# Patient Record
Sex: Male | Born: 1950 | Race: White | Hispanic: No | Marital: Married | Smoking: Current every day smoker
Health system: Southern US, Community
[De-identification: ages and names within clinical notes are randomized; demographics above are authoritative.]

## PROBLEM LIST (undated history)

## (undated) ENCOUNTER — Emergency Department (HOSPITAL_COMMUNITY): Admission: EM

## (undated) DIAGNOSIS — C3490 Malignant neoplasm of unspecified part of unspecified bronchus or lung: Secondary | ICD-10-CM

## (undated) DIAGNOSIS — M1990 Unspecified osteoarthritis, unspecified site: Secondary | ICD-10-CM

## (undated) DIAGNOSIS — J449 Chronic obstructive pulmonary disease, unspecified: Secondary | ICD-10-CM

## (undated) DIAGNOSIS — K5010 Crohn's disease of large intestine without complications: Secondary | ICD-10-CM

## (undated) DIAGNOSIS — R0602 Shortness of breath: Secondary | ICD-10-CM

## (undated) DIAGNOSIS — C801 Malignant (primary) neoplasm, unspecified: Secondary | ICD-10-CM

## (undated) DIAGNOSIS — Z923 Personal history of irradiation: Secondary | ICD-10-CM

## (undated) DIAGNOSIS — R531 Weakness: Secondary | ICD-10-CM

## (undated) DIAGNOSIS — R079 Chest pain, unspecified: Secondary | ICD-10-CM

## (undated) DIAGNOSIS — N2889 Other specified disorders of kidney and ureter: Secondary | ICD-10-CM

## (undated) DIAGNOSIS — J189 Pneumonia, unspecified organism: Secondary | ICD-10-CM

## (undated) DIAGNOSIS — R569 Unspecified convulsions: Secondary | ICD-10-CM

## (undated) DIAGNOSIS — J439 Emphysema, unspecified: Secondary | ICD-10-CM

## (undated) DIAGNOSIS — R059 Cough, unspecified: Secondary | ICD-10-CM

## (undated) DIAGNOSIS — I2699 Other pulmonary embolism without acute cor pulmonale: Secondary | ICD-10-CM

## (undated) DIAGNOSIS — R634 Abnormal weight loss: Secondary | ICD-10-CM

## (undated) DIAGNOSIS — G40909 Epilepsy, unspecified, not intractable, without status epilepticus: Secondary | ICD-10-CM

## (undated) DIAGNOSIS — R05 Cough: Secondary | ICD-10-CM

## (undated) HISTORY — PX: ABDOMINAL SURGERY: SHX537

## (undated) HISTORY — PX: RECTAL SURGERY: SHX760

## (undated) HISTORY — DX: Other specified disorders of kidney and ureter: N28.89

## (undated) HISTORY — DX: Cough, unspecified: R05.9

## (undated) HISTORY — DX: Abnormal weight loss: R63.4

## (undated) HISTORY — DX: Chronic obstructive pulmonary disease, unspecified: J44.9

## (undated) HISTORY — DX: Chest pain, unspecified: R07.9

## (undated) HISTORY — DX: Cough: R05

## (undated) HISTORY — DX: Other pulmonary embolism without acute cor pulmonale: I26.99

## (undated) HISTORY — DX: Unspecified osteoarthritis, unspecified site: M19.90

## (undated) HISTORY — DX: Epilepsy, unspecified, not intractable, without status epilepticus: G40.909

## (undated) HISTORY — DX: Crohn's disease of large intestine without complications: K50.10

## (undated) HISTORY — DX: Personal history of irradiation: Z92.3

## (undated) HISTORY — DX: Emphysema, unspecified: J43.9

## (undated) HISTORY — DX: Weakness: R53.1

## (undated) HISTORY — PX: OTHER SURGICAL HISTORY: SHX169

## (undated) HISTORY — DX: Malignant (primary) neoplasm, unspecified: C80.1

---

## 2002-04-21 ENCOUNTER — Emergency Department (HOSPITAL_COMMUNITY): Admission: EM | Admit: 2002-04-21 | Discharge: 2002-04-22 | Payer: Self-pay | Admitting: Emergency Medicine

## 2003-01-17 ENCOUNTER — Ambulatory Visit (HOSPITAL_COMMUNITY): Admission: RE | Admit: 2003-01-17 | Discharge: 2003-01-17 | Payer: Self-pay | Admitting: Internal Medicine

## 2007-07-08 ENCOUNTER — Ambulatory Visit: Payer: Self-pay | Admitting: Pulmonary Disease

## 2007-07-13 ENCOUNTER — Ambulatory Visit: Payer: Self-pay | Admitting: Pulmonary Disease

## 2007-09-09 DIAGNOSIS — Z86718 Personal history of other venous thrombosis and embolism: Secondary | ICD-10-CM

## 2007-09-09 DIAGNOSIS — R51 Headache: Secondary | ICD-10-CM

## 2007-09-09 DIAGNOSIS — R519 Headache, unspecified: Secondary | ICD-10-CM | POA: Insufficient documentation

## 2007-09-09 DIAGNOSIS — J438 Other emphysema: Secondary | ICD-10-CM

## 2007-09-12 ENCOUNTER — Ambulatory Visit: Payer: Self-pay | Admitting: Pulmonary Disease

## 2007-11-09 ENCOUNTER — Ambulatory Visit: Admission: RE | Admit: 2007-11-09 | Discharge: 2007-11-09 | Payer: Self-pay | Admitting: Orthopedic Surgery

## 2007-12-27 ENCOUNTER — Ambulatory Visit: Payer: Self-pay | Admitting: Pulmonary Disease

## 2007-12-27 DIAGNOSIS — J209 Acute bronchitis, unspecified: Secondary | ICD-10-CM

## 2008-07-02 ENCOUNTER — Ambulatory Visit: Payer: Self-pay | Admitting: Pulmonary Disease

## 2011-04-28 NOTE — Assessment & Plan Note (Signed)
[facility]                             PULMONARY OFFICE NOTE   Anthony [name], Anthony [name]                        MRN:          [id number]  DATE:07/08/2007                            DOB:          Aug 10, 1951    HISTORY OF PRESENT ILLNESS:  The patient is a 60 year old gentleman who  comes in today for evaluation of dyspnea.  Patient states that he has  had shortness of breath for the last 4-5 years but feels that it is  getting progressively worse.  Currently he has gotten to the point where  he has a 1 block dyspnea on exertion and will also get very winded even  bringing one bag of groceries in from the car.  He also has a dry cough  but it is primarily in the mornings.  When he does produce mucus on rare  occasions it is clear.  Patient also describes a pain involving his  right chest that has been present since December.  At that time he had a  COPD exacerbation that required hospitalization.  The pain is sharp in  nature and is worse with deep breathing.  He feels this is a little bit  worse since its initiation in December.  Patient does have a very remote  history of a pulmonary embolus 20+ years ago where he was treated with  anticoagulation.  Patient denies any weight loss of any significance and  has a good appetite.   PAST MEDICAL HISTORY:  1. Emphysema.  2. History of pulmonary emboli that was treated with anticoagulation      20 years ago.  3. History of chronic headaches.  4. History of Crohn's disease.  5. History of seizures for which he sees Dr. Anne [name].  6. History of bipolar disease.   CURRENT MEDICATIONS:  1. Dilantin 430 mg daily.  2. Ativan 1 mg t.i.d.  3. Vitamin B-12 and D injections.  4. DuoNeb by handheld nebulizer 1 q.i.d.  5. Albuterol p.r.n. rescue when away from home.  6. Vicodin 7.5/500 p.r.n.   PATIENT HAS NO KNOWN DRUG ALLERGIES.   SOCIAL HISTORY:  He is married and does have children.  He lives with  his wife and  son.  He is a disabled Naval architect.  He has a history of  smoking up to 3 packs per day for 35 years.  He currently is smoking 1  pack per day.   FAMILY HISTORY:  Remarkable for his parents, brother and sister all  having emphysema.  His parents, brother and sister also had heart  disease.  His brother has had lung cancer.   REVIEW OF SYSTEMS:  As per history of present illness, also see patient  intake form documented in the chart.   PHYSICAL EXAMINATION:  In general he is a well-developed male in no  acute distress.  Blood pressure is 120/74, pulse 88, temperature 97.9,  weight is 162 pounds, O2 saturation on room air is 94%.  HEENT:  Pupils equal, round, reactive to light and accommodation.  Extraocular muscles are intact, nares shows deviated septum to  the left  with near obstruction, oropharynx is clear.  NECK:  Supple without JVD or lymphadenopathy, there is no palpable  thyromegaly.  CHEST:  With very diminished breath sounds throughout but no wheezes or  rhonchi.  CARDIAC:  Reveals regular rate and rhythm, no murmurs, rubs or gallops.  ABDOMEN:  Soft, nontender with good bowel sounds.  GENITAL, RECTAL, BREAST:  Not done and not indicated.  LOWER EXTREMITIES:  Without edema, pulses are intact distally.  NEUROLOGICALLY:  He is alert and oriented with no obvious motor  deficits.   LABORATORY DATA:  Chest x-ray was reviewed from December of 2007 and  shows changes of COPD but no acute process.   IMPRESSION:  1. Probable significant emphysema.  The patient clearly is having      dyspnea on exertion that is affecting his quality of life.  He is      on a bronchodilator regimen but I think he might do better with      long acting anti-cholinergics.  At this point in time I would like      to verify the degree of his airflow obstruction for completeness      and also do a followup chest x-ray, especially in light of him      having chest pain at this time.  2. Chest pain of  unknown etiology. Certainly it sounds somewhat      pleuritic in nature.  I think he will need a chest x-ray to make      sure there is not an underlying pneumothorax or lung mass.  If it      continues he may need evaluation for thromboembolic disease, though      I think this is very less likely.   PLAN:  1. Patient will be scheduled for full PFTs.  2. Will do a chest x-ray and may need evaluation for thromboembolic      disease.  3. Patient will follow up after the above for further evaluation and      changes in his bronchodilator regimen.  4. I have strongly encouraged the patient that he needs to quit      smoking or his lung disease will continue to get worse.     Anthony Share, MD,FCCP  Electronically Signed    [facility]  DD: 07/12/2007  DT: 07/12/2007  Job #: [id number]   cc:   Anthony [name], M.D.

## 2011-04-28 NOTE — Assessment & Plan Note (Signed)
[facility]                             PULMONARY OFFICE NOTE   Anthony [name], Anthony [name]                        MRN:          [id number]  DATE:07/13/2007                            DOB:          [birth year]    SUBJECTIVE:  Anthony [name] comes in today after having pulmonary function  studies.  This showed severe obstructive lung disease with an FEV 1% of  50 and a 16% increase in FEF1 with bronchodilators.  The patient was  found to have no evidence for restriction, and there was significant air  trapping with a very elevated residual volume.  Diffusing capacity was  moderately reduced at 56%.  I had a long discussion with Anthony [name] and  Anthony [name], and I have discussed the severity of Anthony sleep apnea.   OBJECTIVE:  Blood pressure 102/68, pulse 74, temperature 97.4, weight  161 pounds, O2 saturation on room air is 94%.   ASSESSMENT:  1. Severe emphysema documented by pulmonary function studies and      complicated by ongoing smoking.  I have had a very frank discussion      with Anthony [name] today about the severity of Anthony illness, and how if      he continues to smoke he will likely quickly become disabled and      wheelchair bound on oxygen.  I think that we should try him on a      more aggressive bronchodilator regimen and would recommend at this      time both Spiriva and Foradil with Albuterol for rescue only.  The      patient is agreeable to trying this.   PLAN:  1. Will change Anthony nebulizers to p.r.n. severe breakthrough symptoms      only.  2. Spiriva one puff daily and Foradil one puff b.i.d.  3. The patient is to stop smoking.  4. I will see the patient back in two months or sooner if there are      problems.  At that time, if things are a little better, I will      recommend to him pulmonary rehab which may help with building Anthony      endurance and also help with Anthony education about this obstructive      lung disease.     Barbaraann Share, MD,FCCP  Electronically Signed    [facility]  DD: 07/13/2007  DT: 07/14/2007  Job #: [id number]   cc:   Theressa Millard, M.D.

## 2011-04-28 NOTE — H&P (Signed)
[name], [name]                 ACCOUNT NO.:  [id number]   MEDICAL RECORD NO.:  [id number]          PATIENT TYPE:  INP   LOCATION:  NA                           FACILITY:  [facility]   PHYSICIAN:  Anthony [name], M.D.  DATE OF BIRTH:  [birth year]   DATE OF ADMISSION:  11/15/2007  DATE OF DISCHARGE:                              HISTORY & PHYSICAL   PROCEDURE:  Right total hip arthroplasty.   CHIEF COMPLAINT:  Right hip pain.   HISTORY OF PRESENT ILLNESS:  A 59 year old male with a history of right  hip and groin pain secondary to osteoarthritis.  Has been refractory to  all conservative treatment.  Has affected his gait pattern.  Walks with  significant limp.  Has been presurgically assessed by Dr. Theressa [name].   PAST MEDICAL HISTORY:  Significant for:  1. Osteoarthritis.  2. Crohn disease.  3. Seizures.  4. Bipolar.  5. COPD.   PAST SURGICAL HISTORY:  Significant for:  1. Right knee surgery in the past.  2. Left elbow surgery in remote past.  3. Colostomy with reversal.  4. Ileostomy.  5. Rectal abscesses and fistulas repaired.   SOCIAL HISTORY:  The patient is married.  Primary caregiver after  surgery will be his wife, Anthony [name].   DRUG ALLERGIES:  NO KNOWN DRUG ALLERGIES.   MEDICATIONS:  1. Dilantin 100 mg two p.o. b.i.d.  2. Dilantin 30 mg one p.o. b.i.d.  3. Ativan 1 mg t.i.d.  4. Vicodin ES b.i.d. p.r.n.  5. Spiriva daily.  6. Foradil b.i.d.  7. DuoNeb nebulizer b.i.d. p.r.n.  8. Vitamin B12 injections.  9. Viagra 50 mg daily p.r.n.  10.Over-the-counter calcium plus vitamin D.   REVIEW OF SYSTEMS:  NEUROLOGY:  History of seizures, last episode was on  this past Saturday, November 15.  He had five episodes.  Blurred vision.  PULMONARY:  He has shortness of breath.  Remote history 30 years ago of  a pulmonary embolus.  CARDIOVASCULAR:  Shortness of breath during  exertion.  GASTROINTESTINAL:  Colitis and Crohn disease.  Otherwise see  HPI.   FAMILY HISTORY:  1. Heart disease.  2. Diabetes.  3. Cancer.  4. Arthritis.   PHYSICAL EXAMINATION:  Pulse 86, respirations 18, blood pressure 130/70.  GENERAL: Awake, alert and oriented, well developed, well nourished, no  acute distress.  NECK:  Supple.  No carotid bruits.  CHEST:  Decreased breath sounds, otherwise clear.  BREASTS:  Deferred.  HEART:  Regular rate and rhythm without murmurs.  ABDOMEN:  Soft, nontender, nondistended.  Bowel sounds present.  GENITOURINARY:  Deferred.  EXTREMITIES:  Dorsalis pedis pulse positive, right lower extremity.  Right hip:  Increased pain with range of motion with decreased range of  motion.  SKIN:  No cellulitis noted right hip area.  NEUROLOGIC:  Intact distal sensibilities right lower extremity.   Labs, EKG, chest x-ray pending presurgical testing.   IMPRESSION:  1. Osteoarthritis.  2. Chronic obstructive pulmonary disease.  3. Crohn disease.  4. Seizures.  5. Bipolar.   PLAN OF ACTION:  Is a right total hip  arthroplasty by surgeon Dr.  Durene [name].  Risks and complications were discussed with Dr. Charlann [name].   Postoperative medications including Lovenox, Robaxin, iron, aspirin,  Colace, MiraLax provided at time of history and physical.  Postoperative  pain medications will be provided at time of surgery.     ______________________________  Anthony [name], [place]      Anthony [name], M.D.  Electronically Signed    [facility]/MEDQ  D:  11/03/2007  T:  11/04/2007  Job:  [id number]   cc:   Anthony [name], M.D.  Fax: [phone]

## 2011-09-22 LAB — DIFFERENTIAL
Eosinophils Absolute: 0.1 — ABNORMAL LOW
Eosinophils Relative: 1
Lymphocytes Relative: 27
Lymphs Abs: 1.9
Monocytes Absolute: 0.5

## 2011-09-22 LAB — PHENYTOIN LEVEL, TOTAL: Phenytoin Lvl: 14.3

## 2011-09-22 LAB — CBC
HCT: 44
Hemoglobin: 15.4
MCV: 93.4
WBC: 7.2

## 2011-09-22 LAB — URINALYSIS, ROUTINE W REFLEX MICROSCOPIC
Glucose, UA: NEGATIVE
Nitrite: NEGATIVE
Specific Gravity, Urine: 1.011
pH: 6

## 2011-09-22 LAB — BASIC METABOLIC PANEL
BUN: 10
Chloride: 103
GFR calc non Af Amer: 57 — ABNORMAL LOW
Glucose, Bld: 84
Potassium: 4.7
Sodium: 138

## 2012-06-01 ENCOUNTER — Other Ambulatory Visit: Payer: Self-pay | Admitting: Internal Medicine

## 2012-06-01 DIAGNOSIS — R1011 Right upper quadrant pain: Secondary | ICD-10-CM

## 2012-06-01 DIAGNOSIS — R634 Abnormal weight loss: Secondary | ICD-10-CM

## 2012-06-02 ENCOUNTER — Ambulatory Visit
Admission: RE | Admit: 2012-06-02 | Discharge: 2012-06-02 | Disposition: A | Discharge status: Home / Self Care | Payer: Medicare Other | Source: Ambulatory Visit | Attending: Internal Medicine | Admitting: Internal Medicine

## 2012-06-02 DIAGNOSIS — R634 Abnormal weight loss: Secondary | ICD-10-CM

## 2012-06-02 DIAGNOSIS — R1011 Right upper quadrant pain: Secondary | ICD-10-CM

## 2012-06-02 MED ORDER — IOHEXOL 300 MG/ML  SOLN
100.0000 mL | Freq: Once | INTRAMUSCULAR | Status: AC | PRN
  Administered 2012-06-02: 100 mL via INTRAVENOUS

## 2012-06-07 ENCOUNTER — Ambulatory Visit (INDEPENDENT_AMBULATORY_CARE_PROVIDER_SITE_OTHER): Payer: Medicare Other | Admitting: Emergency Medicine

## 2012-06-07 ENCOUNTER — Other Ambulatory Visit (INDEPENDENT_AMBULATORY_CARE_PROVIDER_SITE_OTHER): Payer: Medicare Other

## 2012-06-07 ENCOUNTER — Encounter: Payer: Self-pay | Admitting: Emergency Medicine

## 2012-06-07 VITALS — BP 108/70 | HR 79 | Temp 98.1°F | Ht 71.5 in | Wt 142.0 lb

## 2012-06-07 DIAGNOSIS — R0602 Shortness of breath: Secondary | ICD-10-CM

## 2012-06-07 DIAGNOSIS — R222 Localized swelling, mass and lump, trunk: Secondary | ICD-10-CM

## 2012-06-07 LAB — APTT: aPTT: 26.7 s (ref 21.7–28.8)

## 2012-06-07 LAB — PROTIME-INR: Prothrombin Time: 13.6 s — ABNORMAL HIGH (ref 10.2–12.4)

## 2012-06-07 NOTE — Addendum Note (Signed)
Addended by: Leslye Peer on: 06/07/2012 02:42 PM   Modules accepted: Orders

## 2012-06-07 NOTE — Assessment & Plan Note (Signed)
CT scan and tobacco hx very worrisome for primary lung CA. There appears to be a RUL airway leading to the lesion, probably an endobronchial component. Should be able to bx via standard FOB. Will schedule for 6/27. Will address this issue and then proceed to sort out the L renal mass.

## 2012-06-07 NOTE — Patient Instructions (Addendum)
We will perform bronchoscopy on Thursday 06/09/12 at [facility] Once your biopsies are done we will plan further workup and treatments

## 2012-06-07 NOTE — Progress Notes (Signed)
Subjective:    Patient ID: Anthony [name], male    DOB: 11/27/1951, 61 y.o.   MRN: [id number]  HPI 61 yo man, heavy smoking hx who continues to smoke 1pk a day (100+ pk-yrs), hx Crohn's s/p multiple sgy's and colostomy, seizures, PE (in his 87's), emphysema seen in 2008 and 09 by Dr Shelle Iron. Referred by Dr Darryll Capers for R hilar mass on CT chest 6/20. Of not also found to have renal mass concerning for renal cell CA vs metastatic disease. He has been feeling low energy and losing wt for several months. Also running a fever a week ago. Developed N/V that prompted the CT abd and chest.  FEV1 in 2008 50% predicted.    Review of Systems  Constitutional: Positive for fever and unexpected weight change.  HENT: Negative for ear pain, nosebleeds, congestion, sore throat, rhinorrhea, sneezing, trouble swallowing, dental problem, postnasal drip and sinus pressure.   Eyes: Negative for redness and itching.  Respiratory: Positive for cough, shortness of breath and wheezing. Negative for chest tightness.   Cardiovascular: Negative for palpitations and leg swelling.  Gastrointestinal: Positive for nausea and vomiting.  Genitourinary: Negative for dysuria.  Musculoskeletal: Negative for joint swelling.  Skin: Negative for rash.  Neurological: Positive for headaches.  Hematological: Bruises/bleeds easily.  Psychiatric/Behavioral: Negative for dysphoric mood. The patient is nervous/anxious.     Past Medical History  Diagnosis Date  . Crohn's disease of colon   . Pulmonary emboli   . COPD (chronic obstructive pulmonary disease)   . Emphysema   . Seizure disorder   . Kidney mass      Family History  Problem Relation Age of Onset  . Emphysema Brother   . Emphysema Brother     Died with lung cancer-Jerry  . Liver cancer Brother     Died- Freddy  . Diabetes Brother   . Hypertension Brother   . Diabetes Sister   . Diabetes Sister   . Heart disease Father   . Heart disease Mother   . Heart  disease Brother   . Heart disease Brother     valve reconstruction at young age     History   Social History  . Marital Status: Married    Spouse Name: N/A    Number of Children: 2  . Years of Education: N/A   Occupational History  . disabled    Social History Main Topics  . Smoking status: Current Everyday Smoker -- 1.0 packs/day for 52 years    Types: Cigarettes  . Smokeless tobacco: Not on file   Comment: Pt states he used 2-2.5ppd in past  . Alcohol Use: No  . Drug Use: No     hx of use as teen  . Sexually Active: Not on file   Other Topics Concern  . Not on file   Social History Narrative  . No narrative on file    Current outpatient prescriptions:budesonide-formoterol (SYMBICORT) 160-4.5 MCG/ACT inhaler, Inhale 2 puffs into the lungs 2 (two) times daily., Disp: , Rfl: ;  Calcium Carbonate-Vitamin D (CALCIUM 600 + D PO), Take 1 tablet by mouth daily., Disp: , Rfl: ;  cyanocobalamin 1000 MCG tablet, Take by mouth daily. once monthly., Disp: , Rfl: ;  Hydrocodone-Acetaminophen 10-300 MG TABS, 1-2 tabs daily prn, Disp: , Rfl:  LORazepam (ATIVAN) 1 MG tablet, Take 1 tablet by mouth Three times a day., Disp: , Rfl: ;  phenytoin (DILANTIN) 100 MG ER capsule, Take 2 capsules by mouth Twice  daily., Disp: , Rfl: ;  phenytoin (DILANTIN) 30 MG ER capsule, Take 30 mg by mouth 2 (two) times daily., Disp: , Rfl: ;  PROAIR HFA 108 (90 BASE) MCG/ACT inhaler, Take 2 puffs by mouth Every 4 hours as needed., Disp: , Rfl:  promethazine (PHENERGAN) 25 MG tablet, Take 1 tablet by mouth as needed., Disp: , Rfl: ;  tiotropium (SPIRIVA) 18 MCG inhalation capsule, Place 18 mcg into inhaler and inhale daily., Disp: , Rfl:      Objective:   Physical Exam  Gen: Pleasant, thin man, in no distress,  normal affect  ENT: ? Ulceration R side tongue  mouth clear,  oropharynx clear, no postnasal drip  Neck: No JVD, no TMG, no carotid bruits  Lungs: No use of accessory muscles, decreased and  coarse on the R  Cardiovascular: RRR, heart sounds normal, no murmur or gallops, no peripheral edema  Musculoskeletal: No deformities, no cyanosis or clubbing  Neuro: alert, non focal  Skin: Warm, no lesions or rashes   CT CHEST and Abd 06/02/12  Findings: On the lung window images, there are diffuse changes of  centrilobular emphysema. However there is a large right hilar mass  of 7.1 x 4.8 x 5.3 cm consistent with primary lung carcinoma.  Also, there are poorly defined nodular opacities in the right upper  lobe suspicious for metastases of 7 mm, 9 mm, and 5 mm in diameter.  No pleural effusion is seen.  On soft tissue window images, there is a superior mediastinal  paratracheal node present of 7 mm in short axis diameter. A left  paratracheal node near the carina measures 6 mm in short axis  diameter. Additional smaller nodes are present as well as the  right hilar adenopathy in this patient with suspected primary lung  carcinoma. The thoracic aorta and pulmonary arteries opacify with  no significant abnormality although the right lower lobe pulmonary  artery courses through the lobular right hilar - right upper lobe  mass. No bony abnormality is seen.  IMPRESSION:  1. Large right hilar mass extending into the right upper lobe  consistent with primary lung carcinoma measuring 7.1 x 4.8 x 5.3  cm.  2. Three poorly defined nodular lesions in the right upper lobe  suspicious for metastases.  3. Diffuse centrilobular emphysema.  4. Mediastinal nodes none of which are pathologically enlarged,  but they are suspicious for metastatic involvement in view of the  right hilar mass.  CT ABDOMEN AND PELVIS  Findings: The liver enhances with no evidence of metastatic  involvement. There may be fatty infiltration with some sparing  near the gallbladder. There is a small calcified gallstone  layering in the gallbladder dependently. The pancreas is normal in  size and the pancreatic duct  is minimally prominent. The adrenal  glands and spleen are unremarkable. The stomach is not optimally  distended but no abnormality is noted. The kidneys enhance and  there are multiple bilateral renal cysts present. However, there  is a solid appearing mass in the medial mid upper left kidney of  3.0 x 2.5 x 2.9 cm consistent with either renal cell carcinoma or  metastatic lesion. There is also a 9 mm lesion in the anterior  upper left kidney of 9 mm which may represent a complex cyst but a  metastatic or primary lesion cannot be excluded. The abdominal  aorta is normal in caliber with atheromatous change present. The  mesenteric vasculature is patent. No adenopathy is seen.  The  urinary bladder is moderately distended and unremarkable. The  prostate is prominent measuring 4.4 x 4.2 cm. The patient appears  to have undergone resection of the rectosigmoid colon. The  remainder of the colon is unremarkable and a colostomy is noted in  the left lower quadrant. No pelvic mass or adenopathy is seen.  Significant degenerative joint disease is present involving the  right hip with lesser involvement of the left hip. There is  degenerative disc disease particularly at the L5 S1 level but also  L3-4 and L4-5 levels.  IMPRESSION:  1. 3.0 cm medial left upper renal mass consistent with either  renal cell carcinoma or metastatic lesion.  2. Indeterminate 9 mm lesion in the upper anterior left kidney may  represent complex cyst or primary versus metastatic lesion.  3. No metastatic involvement of the liver is seen.  4. No adenopathy is noted.  5. Prominent prostate.      Assessment & Plan:  Chest mass CT scan and tobacco hx very worrisome for primary lung CA. There appears to be a RUL airway leading to the lesion, probably an endobronchial component. Should be able to bx via standard FOB. Will schedule for 6/27. Will address this issue and then proceed to sort out the L renal mass.

## 2012-06-08 ENCOUNTER — Encounter (HOSPITAL_COMMUNITY): Payer: Self-pay

## 2012-06-09 ENCOUNTER — Encounter (HOSPITAL_COMMUNITY)
Admission: RE | Disposition: A | Discharge status: Home / Self Care | Payer: Self-pay | Source: Ambulatory Visit | Attending: Emergency Medicine

## 2012-06-09 ENCOUNTER — Ambulatory Visit (HOSPITAL_COMMUNITY)
Admission: RE | Admit: 2012-06-09 | Discharge: 2012-06-09 | Disposition: A | Discharge status: Home / Self Care | Payer: Medicare Other | Source: Ambulatory Visit | Attending: Emergency Medicine | Admitting: Emergency Medicine

## 2012-06-09 ENCOUNTER — Encounter (HOSPITAL_COMMUNITY): Payer: Self-pay | Admitting: Respiratory Therapy

## 2012-06-09 DIAGNOSIS — Z86718 Personal history of other venous thrombosis and embolism: Secondary | ICD-10-CM | POA: Insufficient documentation

## 2012-06-09 DIAGNOSIS — R599 Enlarged lymph nodes, unspecified: Secondary | ICD-10-CM | POA: Insufficient documentation

## 2012-06-09 DIAGNOSIS — C801 Malignant (primary) neoplasm, unspecified: Secondary | ICD-10-CM

## 2012-06-09 DIAGNOSIS — N289 Disorder of kidney and ureter, unspecified: Secondary | ICD-10-CM | POA: Insufficient documentation

## 2012-06-09 DIAGNOSIS — R911 Solitary pulmonary nodule: Secondary | ICD-10-CM | POA: Insufficient documentation

## 2012-06-09 DIAGNOSIS — J449 Chronic obstructive pulmonary disease, unspecified: Secondary | ICD-10-CM | POA: Insufficient documentation

## 2012-06-09 DIAGNOSIS — R51 Headache: Secondary | ICD-10-CM | POA: Insufficient documentation

## 2012-06-09 DIAGNOSIS — K5090 Crohn's disease, unspecified, without complications: Secondary | ICD-10-CM | POA: Insufficient documentation

## 2012-06-09 DIAGNOSIS — G40909 Epilepsy, unspecified, not intractable, without status epilepticus: Secondary | ICD-10-CM | POA: Insufficient documentation

## 2012-06-09 DIAGNOSIS — Z79899 Other long term (current) drug therapy: Secondary | ICD-10-CM | POA: Insufficient documentation

## 2012-06-09 DIAGNOSIS — R509 Fever, unspecified: Secondary | ICD-10-CM | POA: Insufficient documentation

## 2012-06-09 DIAGNOSIS — J4489 Other specified chronic obstructive pulmonary disease: Secondary | ICD-10-CM | POA: Insufficient documentation

## 2012-06-09 DIAGNOSIS — C3490 Malignant neoplasm of unspecified part of unspecified bronchus or lung: Secondary | ICD-10-CM

## 2012-06-09 DIAGNOSIS — R222 Localized swelling, mass and lump, trunk: Secondary | ICD-10-CM | POA: Insufficient documentation

## 2012-06-09 DIAGNOSIS — C7A090 Malignant carcinoid tumor of the bronchus and lung: Secondary | ICD-10-CM | POA: Insufficient documentation

## 2012-06-09 DIAGNOSIS — F17200 Nicotine dependence, unspecified, uncomplicated: Secondary | ICD-10-CM | POA: Insufficient documentation

## 2012-06-09 HISTORY — DX: Malignant neoplasm of unspecified part of unspecified bronchus or lung: C34.90

## 2012-06-09 HISTORY — PX: VIDEO BRONCHOSCOPY: SHX5072

## 2012-06-09 HISTORY — DX: Unspecified convulsions: R56.9

## 2012-06-09 HISTORY — DX: Malignant (primary) neoplasm, unspecified: C80.1

## 2012-06-09 SURGERY — VIDEO BRONCHOSCOPY WITHOUT FLUORO
Anesthesia: Moderate Sedation | Laterality: Bilateral

## 2012-06-09 MED ORDER — FENTANYL CITRATE 0.05 MG/ML IJ SOLN
INTRAMUSCULAR | Status: DC | PRN
  Administered 2012-06-09 (×2): 25 ug via INTRAVENOUS
  Administered 2012-06-09: 50 ug via INTRAVENOUS

## 2012-06-09 MED ORDER — PROMETHAZINE HCL 25 MG/ML IJ SOLN
12.5000 mg | Freq: Once | INTRAMUSCULAR | Status: AC
  Administered 2012-06-09: 12.5 mg via INTRAVENOUS

## 2012-06-09 MED ORDER — LIDOCAINE HCL 2 % EX GEL: Freq: Once | CUTANEOUS | Status: DC

## 2012-06-09 MED ORDER — MIDAZOLAM HCL 10 MG/2ML IJ SOLN
INTRAMUSCULAR | Status: DC | PRN
  Administered 2012-06-09: 1 mg via INTRAVENOUS
  Administered 2012-06-09 (×2): 2 mg via INTRAVENOUS
  Administered 2012-06-09: 1 mg via INTRAVENOUS
  Administered 2012-06-09: 2 mg via INTRAVENOUS

## 2012-06-09 MED ORDER — EPINEPHRINE HCL 0.1 MG/ML IJ SOLN
INTRAMUSCULAR | Status: DC | PRN
  Administered 2012-06-09: 0.5 mg via INTRAVENOUS

## 2012-06-09 MED ORDER — LIDOCAINE HCL 1 % IJ SOLN
INTRAMUSCULAR | Status: DC | PRN
  Administered 2012-06-09: 6 mL via RESPIRATORY_TRACT

## 2012-06-09 MED ORDER — SODIUM CHLORIDE 0.9 % IV SOLN
INTRAVENOUS | Status: DC
  Administered 2012-06-09: 11:00:00 via INTRAVENOUS

## 2012-06-09 MED ORDER — PHENYLEPHRINE HCL 0.25 % NA SOLN
NASAL | Status: DC | PRN
  Administered 2012-06-09: 1

## 2012-06-09 MED ORDER — MIDAZOLAM HCL 10 MG/2ML IJ SOLN
INTRAMUSCULAR | Status: AC
  Filled 2012-06-09: qty 4

## 2012-06-09 MED ORDER — PROMETHAZINE HCL 25 MG/ML IJ SOLN
INTRAMUSCULAR | Status: AC
  Filled 2012-06-09: qty 1

## 2012-06-09 MED ORDER — PHENYLEPHRINE HCL 0.25 % NA SOLN: 1.0000 | Freq: Four times a day (QID) | NASAL | Status: DC | PRN

## 2012-06-09 MED ORDER — FENTANYL CITRATE 0.05 MG/ML IJ SOLN
INTRAMUSCULAR | Status: AC
  Filled 2012-06-09: qty 4

## 2012-06-09 MED ORDER — LIDOCAINE HCL 2 % EX GEL
CUTANEOUS | Status: DC | PRN
  Administered 2012-06-09: 1

## 2012-06-09 NOTE — Op Note (Signed)
Video Bronchoscopy Procedure Note  Date of Operation: 06/09/2012  Pre-op Diagnosis: RUL Mass  Post-op Diagnosis: Same  Surgeon: Levy Pupa  Assistants: none  Anesthesia: conscious sedation, moderate sedation  Meds Given: phenergan 12.5mg , fentanyl , versed 8mg  in divided doses, epi 1:10000 dil 5cc total, 1% lidocaine 40cc total  Operation: Flexible video fiberoptic bronchoscopy and biopsies.  Estimated Blood Loss: 25cc  Complications: none noted  Indications and History: Anthony [name] is 61 y.o. with history of RUL mass.  Recommendation was to perform video fiberoptic bronchoscopy with biopsies. The risks, benefits, complications, treatment options and expected outcomes were discussed with the patient.  The possibilities of pneumothorax, pneumonia, reaction to medication, pulmonary aspiration, perforation of a viscus, bleeding, failure to diagnose a condition and creating a complication requiring transfusion or operation were discussed with the patient who freely signed the consent.    Description of Procedure: The patient was seen in the Preoperative Area, was examined and was deemed appropriate to proceed.  The patient was taken to [facility] cardiopulmonary, identified as Anthony [name] and the procedure verified as Flexible Video Fiberoptic Bronchoscopy.  A Time Out was held and the above information confirmed.   Conscious sedation was initiated as indicated above. The video fiberoptic bronchoscope was introduced via the L nare and a general inspection was performed which showed normal cords, normal trachea, normal main carina. The R sided airways were inspected and a RUL endobronchial lesion was seen in the posterior segment. The BI and mucosa surrounding the RUL were swollen and slightly misshapen. The RML and RLL airways were normal. The L side was then inspected. The LLL, Lingular and LUL airways were normal.   1:10000 epi was injected onto the lesion. Wang needle bx,  endobronchial brushings, endobronchial forceps biopsies were performed. There was some initial moderate bleeding that stopped quickly. Finally endobronchial washings were performed in the RUL to be sent for cytology. The patient tolerated the procedure well. The bronchoscope was removed. There were no obvious complications.   Samples: 1. Endobronchial brushings from RUL 2. Wang needle biopsies from RUL 3. Endobronchial forceps biopsies from RUL 4. Bronchial washings from RUL  Plans:  We will review the cytology, pathology and microbiology results with the patient when they become available.  Outpatient followup will be with Dr Delton Coombes.    Levy Pupa, MD, PhD 06/09/2012, 12:16 PM  Pulmonary and Critical Care [phone] or if no answer [phone]

## 2012-06-09 NOTE — Interval H&P Note (Signed)
PCCM Interval History  No new issues since evaluation. Plan is for FOB with bx's He sometimes gets nausea with narcs so will give phenergan   Filed Vitals:   06/09/12 1016  BP: 119/77  Pulse: 78  Resp: 16   Plan: FOB with Biopsies  Levy Pupa, MD, PhD 06/09/2012, 11:11 AM [place] [facility] [phone] or if no answer [phone]

## 2012-06-09 NOTE — Progress Notes (Signed)
Video Bronchoscopy Done  Procedure tolerated well  Intervention Bronchial Biopsy Intervention Bronchial Washing Intervention Bronchial Needle Biopsy Intervention Bronchial Brushings

## 2012-06-09 NOTE — Discharge Instructions (Signed)
Bronchoscopy Care After These instructions give you information on caring for yourself after your procedure. Your doctor may also give you specific instructions. Call your doctor if you have any problems or questions after your procedure. HOME CARE  Do not eat or drink anything for 2 hours after your test. Your nose and throat was numbed by medicine. If you try to eat or drink before the medicine wears off, food or drink could go into your lungs.   For the rest of the first day, eat soft food and drink liquids slowly.   On the day after the test, you can go back to eating your usual food.   Do not drive or sign important papers the day of the test.   Take it easy for the next 2 days. Do not do any heavy work, exercise, or activities.   Only take medicine as told by your doctor. Do not take aspirin.   You may be drowsy for the next 24 hours.   You may see traces of blood in your spit for 1 to 2 days.  Finding out the results of your test Ask when your test results will be ready. Make sure you get your test results. GET HELP RIGHT AWAY IF:  You have breathing problems.   You have a bad sore throat for more than 1 week.   You see traces of blood in your spit for more than 3 days.   You start coughing up blood.   You have a temperature of 102 F (38.9 C) or higher.  MAKE SURE YOU:  Understand these instructions.   Will watch your condition.   Will get help right away if you are not doing well or get worse.  Document Released: 09/27/2009 Document Revised: 11/19/2011 Document Reviewed: 09/27/2009 [facility] Patient Information 2012 [place], [place].

## 2012-06-09 NOTE — Progress Notes (Signed)
Levy Pupa, MD, PhD 06/09/2012, 4:15 PM [place] [facility] [phone] or if no answer [phone]

## 2012-06-09 NOTE — H&P (View-Only) (Signed)
Subjective:    Patient ID: Anthony [name], male    DOB: 03/05/1951, 61 y.o.   MRN: [id number]  HPI 61 yo man, heavy smoking hx who continues to smoke 1pk a day (100+ pk-yrs), hx Crohn's s/p multiple sgy's and colostomy, seizures, PE (in his 20's), emphysema seen in 2008 and 09 by Dr Clance. Referred by Dr John Jenkins for R hilar mass on CT chest 6/20. Of not also found to have renal mass concerning for renal cell CA vs metastatic disease. He has been feeling low energy and losing wt for several months. Also running a fever a week ago. Developed N/V that prompted the CT abd and chest.  FEV1 in 2008 50% predicted.    Review of Systems  Constitutional: Positive for fever and unexpected weight change.  HENT: Negative for ear pain, nosebleeds, congestion, sore throat, rhinorrhea, sneezing, trouble swallowing, dental problem, postnasal drip and sinus pressure.   Eyes: Negative for redness and itching.  Respiratory: Positive for cough, shortness of breath and wheezing. Negative for chest tightness.   Cardiovascular: Negative for palpitations and leg swelling.  Gastrointestinal: Positive for nausea and vomiting.  Genitourinary: Negative for dysuria.  Musculoskeletal: Negative for joint swelling.  Skin: Negative for rash.  Neurological: Positive for headaches.  Hematological: Bruises/bleeds easily.  Psychiatric/Behavioral: Negative for dysphoric mood. The patient is nervous/anxious.     Past Medical History  Diagnosis Date  . Crohn's disease of colon   . Pulmonary emboli   . COPD (chronic obstructive pulmonary disease)   . Emphysema   . Seizure disorder   . Kidney mass      Family History  Problem Relation Age of Onset  . Emphysema Brother   . Emphysema Brother     Died with lung cancer-Jerry  . Liver cancer Brother     Died- Freddy  . Diabetes Brother   . Hypertension Brother   . Diabetes Sister   . Diabetes Sister   . Heart disease Father   . Heart disease Mother   . Heart  disease Brother   . Heart disease Brother     valve reconstruction at young age     History   Social History  . Marital Status: Married    Spouse Name: N/A    Number of Children: 2  . Years of Education: N/A   Occupational History  . disabled    Social History Main Topics  . Smoking status: Current Everyday Smoker -- 1.0 packs/day for 52 years    Types: Cigarettes  . Smokeless tobacco: Not on file   Comment: Pt states he used 2-2.5ppd in past  . Alcohol Use: No  . Drug Use: No     hx of use as teen  . Sexually Active: Not on file   Other Topics Concern  . Not on file   Social History Narrative  . No narrative on file    Current outpatient prescriptions:budesonide-formoterol (SYMBICORT) 160-4.5 MCG/ACT inhaler, Inhale 2 puffs into the lungs 2 (two) times daily., Disp: , Rfl: ;  Calcium Carbonate-Vitamin D (CALCIUM 600 + D PO), Take 1 tablet by mouth daily., Disp: , Rfl: ;  cyanocobalamin 1000 MCG tablet, Take by mouth daily. 1000mcg once monthly., Disp: , Rfl: ;  Hydrocodone-Acetaminophen 10-300 MG TABS, 1-2 tabs daily prn, Disp: , Rfl:  LORazepam (ATIVAN) 1 MG tablet, Take 1 tablet by mouth Three times a day., Disp: , Rfl: ;  phenytoin (DILANTIN) 100 MG ER capsule, Take 2 capsules by mouth Twice   daily., Disp: , Rfl: ;  phenytoin (DILANTIN) 30 MG ER capsule, Take 30 mg by mouth 2 (two) times daily., Disp: , Rfl: ;  PROAIR HFA 108 (90 BASE) MCG/ACT inhaler, Take 2 puffs by mouth Every 4 hours as needed., Disp: , Rfl:  promethazine (PHENERGAN) 25 MG tablet, Take 1 tablet by mouth as needed., Disp: , Rfl: ;  tiotropium (SPIRIVA) 18 MCG inhalation capsule, Place 18 mcg into inhaler and inhale daily., Disp: , Rfl:      Objective:   Physical Exam  Gen: Pleasant, thin man, in no distress,  normal affect  ENT: ? Ulceration R side tongue  mouth clear,  oropharynx clear, no postnasal drip  Neck: No JVD, no TMG, no carotid bruits  Lungs: No use of accessory muscles, decreased and  coarse on the R  Cardiovascular: RRR, heart sounds normal, no murmur or gallops, no peripheral edema  Musculoskeletal: No deformities, no cyanosis or clubbing  Neuro: alert, non focal  Skin: Warm, no lesions or rashes   CT CHEST and Abd 06/02/12  Findings: On the lung window images, there are diffuse changes of  centrilobular emphysema. However there is a large right hilar mass  of 7.1 x 4.8 x 5.3 cm consistent with primary lung carcinoma.  Also, there are poorly defined nodular opacities in the right upper  lobe suspicious for metastases of 7 mm, 9 mm, and 5 mm in diameter.  No pleural effusion is seen.  On soft tissue window images, there is a superior mediastinal  paratracheal node present of 7 mm in short axis diameter. A left  paratracheal node near the carina measures 6 mm in short axis  diameter. Additional smaller nodes are present as well as the  right hilar adenopathy in this patient with suspected primary lung  carcinoma. The thoracic aorta and pulmonary arteries opacify with  no significant abnormality although the right lower lobe pulmonary  artery courses through the lobular right hilar - right upper lobe  mass. No bony abnormality is seen.  IMPRESSION:  1. Large right hilar mass extending into the right upper lobe  consistent with primary lung carcinoma measuring 7.1 x 4.8 x 5.3  cm.  2. Three poorly defined nodular lesions in the right upper lobe  suspicious for metastases.  3. Diffuse centrilobular emphysema.  4. Mediastinal nodes none of which are pathologically enlarged,  but they are suspicious for metastatic involvement in view of the  right hilar mass.  CT ABDOMEN AND PELVIS  Findings: The liver enhances with no evidence of metastatic  involvement. There may be fatty infiltration with some sparing  near the gallbladder. There is a small calcified gallstone  layering in the gallbladder dependently. The pancreas is normal in  size and the pancreatic duct  is minimally prominent. The adrenal  glands and spleen are unremarkable. The stomach is not optimally  distended but no abnormality is noted. The kidneys enhance and  there are multiple bilateral renal cysts present. However, there  is a solid appearing mass in the medial mid upper left kidney of  3.0 x 2.5 x 2.9 cm consistent with either renal cell carcinoma or  metastatic lesion. There is also a 9 mm lesion in the anterior  upper left kidney of 9 mm which may represent a complex cyst but a  metastatic or primary lesion cannot be excluded. The abdominal  aorta is normal in caliber with atheromatous change present. The  mesenteric vasculature is patent. No adenopathy is seen.  The   urinary bladder is moderately distended and unremarkable. The  prostate is prominent measuring 4.4 x 4.2 cm. The patient appears  to have undergone resection of the rectosigmoid colon. The  remainder of the colon is unremarkable and a colostomy is noted in  the left lower quadrant. No pelvic mass or adenopathy is seen.  Significant degenerative joint disease is present involving the  right hip with lesser involvement of the left hip. There is  degenerative disc disease particularly at the L5 S1 level but also  L3-4 and L4-5 levels.  IMPRESSION:  1. 3.0 cm medial left upper renal mass consistent with either  renal cell carcinoma or metastatic lesion.  2. Indeterminate 9 mm lesion in the upper anterior left kidney may  represent complex cyst or primary versus metastatic lesion.  3. No metastatic involvement of the liver is seen.  4. No adenopathy is noted.  5. Prominent prostate.      Assessment & Plan:  Chest mass CT scan and tobacco hx very worrisome for primary lung CA. There appears to be a RUL airway leading to the lesion, probably an endobronchial component. Should be able to bx via standard FOB. Will schedule for 6/27. Will address this issue and then proceed to sort out the L renal mass.     

## 2012-06-10 ENCOUNTER — Encounter (HOSPITAL_COMMUNITY): Payer: Self-pay

## 2012-06-10 ENCOUNTER — Telehealth: Payer: Self-pay | Admitting: Emergency Medicine

## 2012-06-10 ENCOUNTER — Encounter (HOSPITAL_COMMUNITY): Payer: Self-pay | Admitting: Emergency Medicine

## 2012-06-10 DIAGNOSIS — R222 Localized swelling, mass and lump, trunk: Secondary | ICD-10-CM

## 2012-06-10 NOTE — Telephone Encounter (Signed)
Discussed dx with patient's wife Anthony [name], she will relay to [name]. [facility] - will refer to [name] now. He will also need further eval of his L renal mass.  I mistakenly sent consult information to [name] - [name] is actually [name]'s doctor. Will send this note and all other notes to his office now.

## 2012-06-10 NOTE — Telephone Encounter (Signed)
Pt's spouse returned call. Call debra Knobloch @ [phone]. Anthony [name]

## 2012-06-10 NOTE — Telephone Encounter (Signed)
Biopsies from 6/27 show [facility]. I called him but no answer, left message that we would speak later today about results.

## 2012-06-13 ENCOUNTER — Telehealth: Payer: Self-pay | Admitting: *Deleted

## 2012-06-13 ENCOUNTER — Telehealth: Payer: Self-pay | Admitting: Internal Medicine

## 2012-06-13 NOTE — Telephone Encounter (Signed)
Spoke with pt regarding appt time and place.  He verbalized understanding.  Left message at Dr. Kavin Leech office regarding pt appt

## 2012-06-13 NOTE — Telephone Encounter (Signed)
Added lb/FC and np appt w/MM for 7/2 @ 2 pm. D/t per note from from [facility] attached to new pt chart. Also per note pt aware.

## 2012-06-14 ENCOUNTER — Other Ambulatory Visit: Payer: Medicare Other | Admitting: Lab

## 2012-06-14 ENCOUNTER — Encounter: Payer: Self-pay | Admitting: *Deleted

## 2012-06-14 ENCOUNTER — Ambulatory Visit: Payer: Medicare Other

## 2012-06-14 ENCOUNTER — Telehealth: Payer: Self-pay | Admitting: *Deleted

## 2012-06-14 ENCOUNTER — Encounter: Payer: Self-pay | Admitting: Internal Medicine

## 2012-06-14 ENCOUNTER — Ambulatory Visit: Payer: Medicare Other | Admitting: Internal Medicine

## 2012-06-14 ENCOUNTER — Telehealth: Payer: Self-pay | Admitting: Internal Medicine

## 2012-06-14 ENCOUNTER — Other Ambulatory Visit (HOSPITAL_BASED_OUTPATIENT_CLINIC_OR_DEPARTMENT_OTHER): Payer: Medicare Other | Admitting: Lab

## 2012-06-14 ENCOUNTER — Ambulatory Visit (HOSPITAL_BASED_OUTPATIENT_CLINIC_OR_DEPARTMENT_OTHER): Payer: Medicare Other | Admitting: Internal Medicine

## 2012-06-14 VITALS — BP 131/82 | HR 85 | Temp 97.8°F | Ht 71.5 in | Wt 139.6 lb

## 2012-06-14 DIAGNOSIS — C3490 Malignant neoplasm of unspecified part of unspecified bronchus or lung: Secondary | ICD-10-CM | POA: Insufficient documentation

## 2012-06-14 DIAGNOSIS — C3410 Malignant neoplasm of upper lobe, unspecified bronchus or lung: Secondary | ICD-10-CM

## 2012-06-14 LAB — CBC WITH DIFFERENTIAL/PLATELET
BASO%: 0.3 % (ref 0.0–2.0)
Basophils Absolute: 0 10*3/uL (ref 0.0–0.1)
EOS%: 2.5 % (ref 0.0–7.0)
HCT: 39.7 % (ref 38.4–49.9)
HGB: 13.2 g/dL (ref 13.0–17.1)
MCH: 31.1 pg (ref 27.2–33.4)
MCHC: 33.2 g/dL (ref 32.0–36.0)
MCV: 93.7 fL (ref 79.3–98.0)
MONO%: 8.1 % (ref 0.0–14.0)
NEUT%: 70.1 % (ref 39.0–75.0)
lymph#: 1.5 10*3/uL (ref 0.9–3.3)

## 2012-06-14 LAB — COMPREHENSIVE METABOLIC PANEL
ALT: 53 U/L (ref 0–53)
AST: 26 U/L (ref 0–37)
Alkaline Phosphatase: 316 U/L — ABNORMAL HIGH (ref 39–117)
BUN: 17 mg/dL (ref 6–23)
Creatinine, Ser: 1.15 mg/dL (ref 0.50–1.35)
Total Bilirubin: 0.5 mg/dL (ref 0.3–1.2)

## 2012-06-14 MED ORDER — PROCHLORPERAZINE MALEATE 10 MG PO TABS: 10.0000 mg | ORAL_TABLET | Freq: Four times a day (QID) | ORAL | Status: DC | PRN

## 2012-06-14 NOTE — Telephone Encounter (Signed)
gv pt appt schedule for July/August including ct for 7/3. Ms Bonita Quin aware of ct.

## 2012-06-14 NOTE — Progress Notes (Signed)
[facility] [phone]   [phone]  CONSULT NOTE  REASON FOR CONSULTATION:  61 years old white male diagnosed with lung cancer  HPI Anthony [name] is a 61 y.o. male was past medical history significant for COPD, Crohn's disease status post multiple surgeries and colostomy as well as history of pulmonary embolism in his 63s. The patient also has a long history of smoking. Few weeks ago he start complaining of increasing fatigue and weakness as well as fever and nausea and vomiting and pain in the right side of his chest. He was seen by his primary care physician Dr. Kirby Funk who ordered chest x-ray and it showed right lung mass. This was followed by CT scan of the chest, abdomen and pelvis on 06/02/2012 and it showed a large right hilar mass of 7.1 x 4.8 x 5.3 cm consistent with primary lung carcinoma. Also, there are poorly defined nodular opacities in the right upper lobe suspicious for metastases of 7 mm, 9 mm, and 5 mm in diameter. Mediastinal nodes none of which are pathologically enlarged,  but they are suspicious for metastatic involvement in view of the right hilar mass.3.0 cm medial left upper renal mass consistent with either renal cell carcinoma or metastatic lesion. ndeterminate 9 mm lesion in the upper anterior left kidney may represent complex cyst or primary versus metastatic lesion. No metastatic involvement of the liver is seen. No adenopathy is noted. The patient was referred to Dr. Delton Coombes and on 06/09/2012 he underwent a video bronchoscopy with biopsy of the right hilar mass. 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 Dr. Delton Coombes kindly referred the patient to me today for evaluation and recommendation regarding treatment of his condition. When seen today the patient continues to complain of pain on the right side of his chest as well as shortness of breath with exertion and cough  productive of whitish sputum but no hemoptysis. He lost around 17 pounds in the last 6 months. He has some headaches but no visual changes. He would not be able to do MRIs because of several staples in his colostomy area. The patient is married and has 2 children. He was accompanied by his wife Anthony [name] and his brother Anthony [name]. The patient has a long history of smoking  3 packs per day for around 42 years and still smoking around one pack a day. He has 2 brother diagnosed previously with lung cancer. He also has a history of alcohol and drug abuse in the past.   @SFHPI @  Past Medical History  Diagnosis Date  . Crohn's disease of colon   . Pulmonary emboli   . COPD (chronic obstructive pulmonary disease)   . Emphysema   . Seizure disorder   . Kidney mass   . Seizures     Past Surgical History  Procedure Date  . Arm surgery     left  . Right knee   . Rectal surgery     x7  . Abdominal surgery     x3  . Video bronchoscopy 06/09/2012    Procedure: VIDEO BRONCHOSCOPY WITHOUT FLUORO;  Surgeon: Leslye Peer, MD;  Location: [facility];  Service: Cardiopulmonary;  Laterality: Bilateral;    Family History  Problem Relation Age of Onset  . Emphysema Brother   . Emphysema Brother     Died with lung cancer-Anthony [name]  . Liver cancer Brother     Died- Anthony [name]  . Diabetes Brother   .  Hypertension Brother   . Diabetes Sister   . Diabetes Sister   . Heart disease Father   . Heart disease Mother   . Heart disease Brother   . Heart disease Brother     valve reconstruction at young age    Social History History  Substance Use Topics  . Smoking status: Current Everyday Smoker -- 1.0 packs/day for 52 years    Types: Cigarettes  . Smokeless tobacco: Not on file   Comment: Pt states he used 2-2.5ppd in past  . Alcohol Use: No    No Known Allergies  Current Outpatient Prescriptions  Medication Sig Dispense Refill  . budesonide-formoterol (SYMBICORT) 160-4.5 MCG/ACT inhaler Inhale 2  puffs into the lungs 2 (two) times daily.      . Calcium Carbonate-Vitamin D (CALCIUM 600 + D PO) Take 1 tablet by mouth daily.      . cyanocobalamin 1000 MCG tablet Take by mouth daily. once monthly.      . Hydrocodone-Acetaminophen 10-300 MG TABS 1-2 tabs daily prn      . LORazepam (ATIVAN) 1 MG tablet Take 1 tablet by mouth Three times a day.      . phenytoin (DILANTIN) 100 MG ER capsule Take 2 capsules by mouth Twice daily.      . phenytoin (DILANTIN) 30 MG ER capsule Take 30 mg by mouth 2 (two) times daily.      Marland Kitchen PROAIR HFA 108 (90 BASE) MCG/ACT inhaler Take 2 puffs by mouth Every 4 hours as needed.      . promethazine (PHENERGAN) 25 MG tablet Take 1 tablet by mouth as needed.      . tiotropium (SPIRIVA) 18 MCG inhalation capsule Place 18 mcg into inhaler and inhale daily.      . prochlorperazine (COMPAZINE) 10 MG tablet Take 1 tablet (10 mg total) by mouth every 6 (six) hours as needed.  60 tablet  0    Review of Systems  A comprehensive review of systems was negative except for: Constitutional: positive for anorexia, fatigue and weight loss Respiratory: positive for cough, dyspnea on exertion, pleurisy/chest pain and wheezing Neurological: positive for headaches  Physical Exam  AVW:UJWJX, healthy, no distress, well nourished and well developed SKIN: skin color, texture, turgor are normal HEAD: Normocephalic, No masses, lesions, tenderness or abnormalities EYES: normal EARS: External ears normal OROPHARYNX:no exudate and no erythema  NECK: supple, no adenopathy LYMPH:  no palpable lymphadenopathy, no hepatosplenomegaly LUNGS: clear to auscultation  HEART: regular rate & rhythm, no murmurs and no gallops ABDOMEN:abdomen soft, non-tender, normal bowel sounds and no masses or organomegaly BACK: Back symmetric, no curvature. EXTREMITIES:no joint deformities, effusion, or inflammation, no edema, no skin discoloration, no clubbing, no cyanosis  NEURO: alert & oriented x 3  with fluent speech, no focal motor/sensory deficits, gait normal  PERFORMANCE STATUS: ECOG 1  LABORATORY DATA: Lab Results  Component Value Date   WBC 7.8 06/14/2012   HGB 13.2 06/14/2012   HCT 39.7 06/14/2012   MCV 93.7 06/14/2012   PLT 283 06/14/2012      Chemistry      Component Value Date/Time   NA 138 11/09/2007 1215   K 4.7 11/09/2007 1215   CL 103 11/09/2007 1215   CO2 28 11/09/2007 1215   BUN 10 11/09/2007 1215   CREATININE 1.30 11/09/2007 1215      Component Value Date/Time   CALCIUM 8.9 11/09/2007 1215       RADIOGRAPHIC STUDIES: Ct Chest W Contrast  06/02/2012  *  RADIOLOGY REPORT*  Clinical Data:  Rounded mass noted on chest x-ray, right upper quadrant pain, smoking history, cough and shortness of breath, elevated liver function tests, known Crohn's disease  CT CHEST, ABDOMEN AND PELVIS WITH CONTRAST  Technique:  Multidetector CT imaging of the chest, abdomen and pelvis was performed following the standard protocol during bolus administration of intravenous contrast.  Contrast: OMNIPAQUE IOHEXOL 300 MG/ML  SOLN  Comparison:  Abdomen films from yesterday.  CT CHEST  Findings:  On the lung window images, there are diffuse changes of centrilobular emphysema.  However there is a large right hilar mass of 7.1 x 4.8 x 5.3 cm consistent with primary lung carcinoma. Also, there are poorly defined nodular opacities in the right upper lobe suspicious for metastases of 7 mm, 9 mm, and 5 mm in diameter. No pleural effusion is seen.  On soft tissue window images, there is a superior mediastinal paratracheal node present of 7 mm in short axis diameter.  A left paratracheal node near the carina measures 6 mm in short axis diameter.  Additional smaller nodes are present as well as the right hilar adenopathy in this patient with suspected primary lung carcinoma.  The thoracic aorta and pulmonary arteries opacify with no significant abnormality although the right lower lobe pulmonary artery  courses through the lobular right hilar - right upper lobe mass.  No bony abnormality is seen.  IMPRESSION:  1.  Large right hilar mass extending into the right upper lobe consistent with primary lung carcinoma measuring 7.1 x 4.8 x 5.3 cm. 2.  Three poorly defined nodular lesions in the right upper lobe suspicious for metastases. 3.  Diffuse centrilobular emphysema. 4.  Mediastinal nodes none of which are pathologically enlarged, but they are suspicious for metastatic involvement in view of the right hilar mass.  CT ABDOMEN AND PELVIS  Findings:  The liver enhances with no evidence of metastatic involvement.  There may be fatty infiltration with some sparing near the gallbladder.  There is a small calcified gallstone layering in the gallbladder dependently.  The pancreas is normal in size and the pancreatic duct is minimally prominent.  The adrenal glands and spleen are unremarkable.  The stomach is not optimally distended but no abnormality is noted.  The kidneys enhance and there are multiple bilateral renal cysts present.  However, there is a solid appearing mass in the medial mid upper left kidney of 3.0 x 2.5 x 2.9 cm consistent with either renal cell carcinoma or metastatic lesion.  There is also a 9 mm lesion in the anterior upper left kidney of 9 mm which may represent a complex cyst but a metastatic or primary lesion cannot be excluded.  The abdominal aorta is normal in caliber with atheromatous change present.  The mesenteric vasculature is patent.  No adenopathy is seen.  The urinary bladder is moderately distended and unremarkable.  The prostate is prominent measuring 4.4 x 4.2 cm.  The patient appears to have undergone resection of the rectosigmoid colon.  The remainder of the colon is unremarkable and a colostomy is noted in the left lower quadrant.  No pelvic mass or adenopathy is seen. Significant degenerative joint disease is present involving the right hip with lesser involvement of the left hip.   There is degenerative disc disease particularly at the L5 S1 level but also L3-4 and L4-5 levels.  IMPRESSION:  1.  3.0 cm medial left upper renal mass consistent with either renal cell carcinoma or metastatic lesion. 2.  Indeterminate 9 mm lesion in the upper anterior left kidney may represent complex cyst or primary versus metastatic lesion. 3.  No metastatic involvement of the liver is seen. 4.  No adenopathy is noted. 5.  Prominent prostate.  Original Report Authenticated By: Juline Patch, M.D.   Ct Abdomen Pelvis W Contrast  06/02/2012  *RADIOLOGY REPORT*  Clinical Data:  Rounded mass noted on chest x-ray, right upper quadrant pain, smoking history, cough and shortness of breath, elevated liver function tests, known Crohn's disease  CT CHEST, ABDOMEN AND PELVIS WITH CONTRAST  Technique:  Multidetector CT imaging of the chest, abdomen and pelvis was performed following the standard protocol during bolus administration of intravenous contrast.  Contrast: OMNIPAQUE IOHEXOL 300 MG/ML  SOLN  Comparison:  Abdomen films from yesterday.  CT CHEST  Findings:  On the lung window images, there are diffuse changes of centrilobular emphysema.  However there is a large right hilar mass of 7.1 x 4.8 x 5.3 cm consistent with primary lung carcinoma. Also, there are poorly defined nodular opacities in the right upper lobe suspicious for metastases of 7 mm, 9 mm, and 5 mm in diameter. No pleural effusion is seen.  On soft tissue window images, there is a superior mediastinal paratracheal node present of 7 mm in short axis diameter.  A left paratracheal node near the carina measures 6 mm in short axis diameter.  Additional smaller nodes are present as well as the right hilar adenopathy in this patient with suspected primary lung carcinoma.  The thoracic aorta and pulmonary arteries opacify with no significant abnormality although the right lower lobe pulmonary artery courses through the lobular right hilar - right upper  lobe mass.  No bony abnormality is seen.  IMPRESSION:  1.  Large right hilar mass extending into the right upper lobe consistent with primary lung carcinoma measuring 7.1 x 4.8 x 5.3 cm. 2.  Three poorly defined nodular lesions in the right upper lobe suspicious for metastases. 3.  Diffuse centrilobular emphysema. 4.  Mediastinal nodes none of which are pathologically enlarged, but they are suspicious for metastatic involvement in view of the right hilar mass.  CT ABDOMEN AND PELVIS  Findings:  The liver enhances with no evidence of metastatic involvement.  There may be fatty infiltration with some sparing near the gallbladder.  There is a small calcified gallstone layering in the gallbladder dependently.  The pancreas is normal in size and the pancreatic duct is minimally prominent.  The adrenal glands and spleen are unremarkable.  The stomach is not optimally distended but no abnormality is noted.  The kidneys enhance and there are multiple bilateral renal cysts present.  However, there is a solid appearing mass in the medial mid upper left kidney of 3.0 x 2.5 x 2.9 cm consistent with either renal cell carcinoma or metastatic lesion.  There is also a 9 mm lesion in the anterior upper left kidney of 9 mm which may represent a complex cyst but a metastatic or primary lesion cannot be excluded.  The abdominal aorta is normal in caliber with atheromatous change present.  The mesenteric vasculature is patent.  No adenopathy is seen.  The urinary bladder is moderately distended and unremarkable.  The prostate is prominent measuring 4.4 x 4.2 cm.  The patient appears to have undergone resection of the rectosigmoid colon.  The remainder of the colon is unremarkable and a colostomy is noted in the left lower quadrant.  No pelvic mass or adenopathy is seen.  Significant degenerative joint disease is present involving the right hip with lesser involvement of the left hip.  There is degenerative disc disease particularly at  the L5 S1 level but also L3-4 and L4-5 levels.  IMPRESSION:  1.  3.0 cm medial left upper renal mass consistent with either renal cell carcinoma or metastatic lesion. 2.  Indeterminate 9 mm lesion in the upper anterior left kidney may represent complex cyst or primary versus metastatic lesion. 3.  No metastatic involvement of the liver is seen. 4.  No adenopathy is noted. 5.  Prominent prostate.  Original Report Authenticated By: Juline Patch, M.D.    ASSESSMENT: This is a very pleasant 61 years old white male recently diagnosed with small cell lung cancer, extensive stage disease.  PLAN: I have a lengthy discussion with the patient and his family today about his disease stage, prognosis and treatment options. I recommended for the patient the following: #1 will complete the staging workup by ordering CT of the head to rule out brain metastasis. The patient cannot have MRI because of multiple staples in his abdomen. #2 I recommended for the patient's systemic chemotherapy with carboplatin for AUC of 5 on day 1 and etoposide 100 mg/M2 on days 1, 2 and 3 with Neulasta support on day 4. I discussed with the patient adverse effect of this treatment including but not limited to alopecia, myelosuppression, nausea and vomiting, peripheral neuropathy, liver or renal dysfunction. The patient agreed to proceed with treatment as planned. #3 he will start the first cycle of his treatment next week. He would have a chemotherapy education class before the first treatment. #4 I gave the patient prescription today for Compazine 10 mg by mouth every 6 hours as needed for nausea. #5 the patient would come back for followup visit in 2 weeks for reevaluation and management any adverse effect of his chemotherapy. I gave the patient and his family the time to ask questions and I answered them completely to their satisfaction.  All questions were answered. The patient knows to call the clinic with any problems, questions or  concerns. We can certainly see the patient much sooner if necessary.  Thank you so much for allowing me to participate in the care of Anthony [name]. I will continue to follow up the patient with you and assist in his care.  I spent 35 minutes counseling the patient face to face. The total time spent in the appointment was 65 minutes.   Miko Markwood K. 06/14/2012, 3:44 PM

## 2012-06-14 NOTE — Telephone Encounter (Signed)
Spoke with wife regarding appt time.  They are unable to make earlier appt time.  They will be here at [facility] at 2:00 today

## 2012-06-14 NOTE — Progress Notes (Signed)
New patient today accompanied by wife, brother and niece, very pleasant patient aware of financial assistance patient asked about getting medicaid I referred patient to the social service department in his county.

## 2012-06-14 NOTE — Telephone Encounter (Signed)
Called pt left message with new appt time today at 1:00

## 2012-06-14 NOTE — Progress Notes (Signed)
Spoke with pt and family at [facility] today.  Educational/resource information given and explained.  Smoking cessation information given and encouraged.  Questions and concerns addressed

## 2012-06-14 NOTE — Telephone Encounter (Signed)
Referred by Dr. Levy Pupa Dx- Lung Ca

## 2012-06-15 ENCOUNTER — Encounter: Payer: Self-pay | Admitting: *Deleted

## 2012-06-15 ENCOUNTER — Ambulatory Visit (HOSPITAL_COMMUNITY)
Admission: RE | Admit: 2012-06-15 | Discharge: 2012-06-15 | Disposition: A | Discharge status: Home / Self Care | Payer: Medicare Other | Source: Ambulatory Visit | Attending: Internal Medicine | Admitting: Internal Medicine

## 2012-06-15 ENCOUNTER — Other Ambulatory Visit: Payer: Self-pay | Admitting: Internal Medicine

## 2012-06-15 ENCOUNTER — Other Ambulatory Visit: Payer: Medicare Other

## 2012-06-15 ENCOUNTER — Telehealth: Payer: Self-pay | Admitting: *Deleted

## 2012-06-15 DIAGNOSIS — C3490 Malignant neoplasm of unspecified part of unspecified bronchus or lung: Secondary | ICD-10-CM | POA: Insufficient documentation

## 2012-06-15 DIAGNOSIS — R51 Headache: Secondary | ICD-10-CM | POA: Insufficient documentation

## 2012-06-15 MED ORDER — IOHEXOL 300 MG/ML  SOLN
100.0000 mL | Freq: Once | INTRAMUSCULAR | Status: AC | PRN
  Administered 2012-06-15: 100 mL via INTRAVENOUS

## 2012-06-15 NOTE — Telephone Encounter (Signed)
[name] called wanting to see if Dr Donnald Garre would increase pt's ativan dose (currently taking 1mg  TID).  Per Dr Donnald Garre, will not increase dose, he needs to contact the provider that gave him original rx for ativan about a dose increase.  Left vm on debra's phone [phone].  SLJ

## 2012-06-20 ENCOUNTER — Other Ambulatory Visit: Payer: Self-pay | Admitting: Medical Oncology

## 2012-06-20 ENCOUNTER — Ambulatory Visit (HOSPITAL_BASED_OUTPATIENT_CLINIC_OR_DEPARTMENT_OTHER): Payer: Medicare Other

## 2012-06-20 ENCOUNTER — Other Ambulatory Visit (HOSPITAL_BASED_OUTPATIENT_CLINIC_OR_DEPARTMENT_OTHER): Payer: Medicare Other | Admitting: Lab

## 2012-06-20 ENCOUNTER — Other Ambulatory Visit: Payer: Self-pay | Admitting: Internal Medicine

## 2012-06-20 VITALS — BP 126/69 | HR 82 | Temp 98.3°F

## 2012-06-20 DIAGNOSIS — C3410 Malignant neoplasm of upper lobe, unspecified bronchus or lung: Secondary | ICD-10-CM

## 2012-06-20 DIAGNOSIS — Z5111 Encounter for antineoplastic chemotherapy: Secondary | ICD-10-CM

## 2012-06-20 DIAGNOSIS — C3490 Malignant neoplasm of unspecified part of unspecified bronchus or lung: Secondary | ICD-10-CM

## 2012-06-20 DIAGNOSIS — I878 Other specified disorders of veins: Secondary | ICD-10-CM

## 2012-06-20 LAB — COMPREHENSIVE METABOLIC PANEL
Albumin: 3.8 g/dL (ref 3.5–5.2)
Alkaline Phosphatase: 188 U/L — ABNORMAL HIGH (ref 39–117)
BUN: 17 mg/dL (ref 6–23)
Calcium: 8.8 mg/dL (ref 8.4–10.5)
Creatinine, Ser: 1.15 mg/dL (ref 0.50–1.35)
Glucose, Bld: 97 mg/dL (ref 70–99)
Potassium: 3.9 mEq/L (ref 3.5–5.3)

## 2012-06-20 LAB — CBC WITH DIFFERENTIAL/PLATELET
Basophils Absolute: 0 10*3/uL (ref 0.0–0.1)
EOS%: 2.1 % (ref 0.0–7.0)
HCT: 39 % (ref 38.4–49.9)
HGB: 13.2 g/dL (ref 13.0–17.1)
LYMPH%: 27.1 % (ref 14.0–49.0)
MCH: 30.4 pg (ref 27.2–33.4)
MCHC: 33.8 g/dL (ref 32.0–36.0)
MCV: 89.9 fL (ref 79.3–98.0)
NEUT%: 61.8 % (ref 39.0–75.0)
Platelets: 306 10*3/uL (ref 140–400)
lymph#: 2 10*3/uL (ref 0.9–3.3)

## 2012-06-20 MED ORDER — SODIUM CHLORIDE 0.9 % IV SOLN: Freq: Once | INTRAVENOUS | Status: DC

## 2012-06-20 MED ORDER — DEXAMETHASONE SODIUM PHOSPHATE 4 MG/ML IJ SOLN
20.0000 mg | Freq: Once | INTRAMUSCULAR | Status: AC
  Administered 2012-06-20: 20 mg via INTRAVENOUS

## 2012-06-20 MED ORDER — ONDANSETRON 16 MG/50ML IVPB (CHCC)
16.0000 mg | Freq: Once | INTRAVENOUS | Status: AC
  Administered 2012-06-20: 16 mg via INTRAVENOUS

## 2012-06-20 MED ORDER — SODIUM CHLORIDE 0.9 % IV SOLN
100.0000 mg/m2 | Freq: Once | INTRAVENOUS | Status: AC
  Administered 2012-06-20: 180 mg via INTRAVENOUS
  Filled 2012-06-20: qty 9

## 2012-06-20 MED ORDER — SODIUM CHLORIDE 0.9 % IV SOLN
431.0000 mg | Freq: Once | INTRAVENOUS | Status: AC
  Administered 2012-06-20: 430 mg via INTRAVENOUS
  Filled 2012-06-20: qty 43

## 2012-06-20 NOTE — Patient Instructions (Addendum)
Etoposide, VP-16 injection What is this medicine? ETOPOSIDE, VP-16 (e toe POE side) is a chemotherapy drug. It is used to treat testicular cancer, lung cancer, and other cancers. This medicine may be used for other purposes; ask your health care provider or pharmacist if you have questions. What should I tell my health care provider before I take this medicine? They need to know if you have any of these conditions: -infection -kidney disease -low blood counts, like low white cell, platelet, or red cell counts -an unusual or allergic reaction to etoposide, other chemotherapeutic agents, other medicines, foods, dyes, or preservatives -pregnant or trying to get pregnant -breast-feeding How should I use this medicine? This medicine is for infusion into a vein. It is administered in a hospital or clinic by a specially trained health care professional. Talk to your pediatrician regarding the use of this medicine in children. Special care may be needed. Overdosage: If you think you have taken too much of this medicine contact a poison control center or emergency room at once. NOTE: This medicine is only for you. Do not share this medicine with others. What if I miss a dose? It is important not to miss your dose. Call your doctor or health care professional if you are unable to keep an appointment. What may interact with this medicine? -cyclosporine -medicines to increase blood counts like filgrastim, pegfilgrastim, sargramostim -vaccines This list may not describe all possible interactions. Give your health care provider a list of all the medicines, herbs, non-prescription drugs, or dietary supplements you use. Also tell them if you smoke, drink alcohol, or use illegal drugs. Some items may interact with your medicine. What should I watch for while using this medicine? Visit your doctor for checks on your progress. This drug may make you feel generally unwell. This is not uncommon, as chemotherapy  can affect healthy cells as well as cancer cells. Report any side effects. Continue your course of treatment even though you feel ill unless your doctor tells you to stop. In some cases, you may be given additional medicines to help with side effects. Follow all directions for their use. Call your doctor or health care professional for advice if you get a fever, chills or sore throat, or other symptoms of a cold or flu. Do not treat yourself. This drug decreases your body's ability to fight infections. Try to avoid being around people who are sick. This medicine may increase your risk to bruise or bleed. Call your doctor or health care professional if you notice any unusual bleeding. Be careful brushing and flossing your teeth or using a toothpick because you may get an infection or bleed more easily. If you have any dental work done, tell your dentist you are receiving this medicine. Avoid taking products that contain aspirin, acetaminophen, ibuprofen, naproxen, or ketoprofen unless instructed by your doctor. These medicines may hide a fever. Do not become pregnant while taking this medicine. Women should inform their doctor if they wish to become pregnant or think they might be pregnant. There is a potential for serious side effects to an unborn child. Talk to your health care professional or pharmacist for more information. Do not breast-feed an infant while taking this medicine. What side effects may I notice from receiving this medicine? Side effects that you should report to your doctor or health care professional as soon as possible: -allergic reactions like skin rash, itching or hives, swelling of the face, lips, or tongue -low blood counts - this medicine  may decrease the number of white blood cells, red blood cells and platelets. You may be at increased risk for infections and bleeding. -signs of infection - fever or chills, cough, sore throat, pain or difficulty passing urine -signs of  decreased platelets or bleeding - bruising, pinpoint red spots on the skin, black, tarry stools, blood in the urine -signs of decreased red blood cells - unusually weak or tired, fainting spells, lightheadedness -breathing problems -changes in vision -mouth or throat sores or ulcers -pain, redness, swelling or irritation at the injection site -pain, tingling, numbness in the hands or feet -redness, blistering, peeling or loosening of the skin, including inside the mouth -seizures -vomiting Side effects that usually do not require medical attention (report to your doctor or health care professional if they continue or are bothersome): -diarrhea -hair loss -loss of appetite -nausea -stomach pain This list may not describe all possible side effects. Call your doctor for medical advice about side effects. You may report side effects to FDA at 1-800-FDA-1088. Where should I keep my medicine? This drug is given in a hospital or clinic and will not be stored at home. NOTE: This sheet is a summary. It may not cover all possible information. If you have questions about this medicine, talk to your doctor, pharmacist, or health care provider.  2012, [employer]. (04/02/2008 5:24:12 PM)Oxaliplatin Injection What is this medicine? OXALIPLATIN (ox AL i PLA tin) is a chemotherapy drug. It targets fast dividing cells, like cancer cells, and causes these cells to die. This medicine is used to treat cancers of the colon and rectum, and many other cancers. This medicine may be used for other purposes; ask your health care provider or pharmacist if you have questions. What should I tell my health care provider before I take this medicine? They need to know if you have any of these conditions: -kidney disease -an unusual or allergic reaction to oxaliplatin, other chemotherapy, other medicines, foods, dyes, or preservatives -pregnant or trying to get pregnant -breast-feeding How should I use this  medicine? This drug is given as an infusion into a vein. It is administered in a hospital or clinic by a specially trained health care professional. Talk to your pediatrician regarding the use of this medicine in children. Special care may be needed. Overdosage: If you think you have taken too much of this medicine contact a poison control center or emergency room at once. NOTE: This medicine is only for you. Do not share this medicine with others. What if I miss a dose? It is important not to miss a dose. Call your doctor or health care professional if you are unable to keep an appointment. What may interact with this medicine? -medicines to increase blood counts like filgrastim, pegfilgrastim, sargramostim -probenecid -some antibiotics like amikacin, gentamicin, neomycin, polymyxin B, streptomycin, tobramycin -zalcitabine Talk to your doctor or health care professional before taking any of these medicines: -acetaminophen -aspirin -ibuprofen -ketoprofen -naproxen This list may not describe all possible interactions. Give your health care provider a list of all the medicines, herbs, non-prescription drugs, or dietary supplements you use. Also tell them if you smoke, drink alcohol, or use illegal drugs. Some items may interact with your medicine. What should I watch for while using this medicine? Your condition will be monitored carefully while you are receiving this medicine. You will need important blood work done while you are taking this medicine. This medicine can make you more sensitive to cold. Do not drink cold drinks or  use ice. Cover exposed skin before coming in contact with cold temperatures or cold objects. When out in cold weather wear warm clothing and cover your mouth and nose to warm the air that goes into your lungs. Tell your doctor if you get sensitive to the cold. This drug may make you feel generally unwell. This is not uncommon, as chemotherapy can affect healthy cells as  well as cancer cells. Report any side effects. Continue your course of treatment even though you feel ill unless your doctor tells you to stop. In some cases, you may be given additional medicines to help with side effects. Follow all directions for their use. Call your doctor or health care professional for advice if you get a fever, chills or sore throat, or other symptoms of a cold or flu. Do not treat yourself. This drug decreases your body's ability to fight infections. Try to avoid being around people who are sick. This medicine may increase your risk to bruise or bleed. Call your doctor or health care professional if you notice any unusual bleeding. Be careful brushing and flossing your teeth or using a toothpick because you may get an infection or bleed more easily. If you have any dental work done, tell your dentist you are receiving this medicine. Avoid taking products that contain aspirin, acetaminophen, ibuprofen, naproxen, or ketoprofen unless instructed by your doctor. These medicines may hide a fever. Do not become pregnant while taking this medicine. Women should inform their doctor if they wish to become pregnant or think they might be pregnant. There is a potential for serious side effects to an unborn child. Talk to your health care professional or pharmacist for more information. Do not breast-feed an infant while taking this medicine. Call your doctor or health care professional if you get diarrhea. Do not treat yourself. What side effects may I notice from receiving this medicine? Side effects that you should report to your doctor or health care professional as soon as possible: -allergic reactions like skin rash, itching or hives, swelling of the face, lips, or tongue -low blood counts - This drug may decrease the number of white blood cells, red blood cells and platelets. You may be at increased risk for infections and bleeding. -signs of infection - fever or chills, cough, sore  throat, pain or difficulty passing urine -signs of decreased platelets or bleeding - bruising, pinpoint red spots on the skin, black, tarry stools, nosebleeds -signs of decreased red blood cells - unusually weak or tired, fainting spells, lightheadedness -breathing problems -chest pain, pressure -cough -diarrhea -jaw tightness -mouth sores -nausea and vomiting -pain, swelling, redness or irritation at the injection site -pain, tingling, numbness in the hands or feet -problems with balance, talking, walking -redness, blistering, peeling or loosening of the skin, including inside the mouth -trouble passing urine or change in the amount of urine Side effects that usually do not require medical attention (report to your doctor or health care professional if they continue or are bothersome): -changes in vision -constipation -hair loss -loss of appetite -metallic taste in the mouth or changes in taste -stomach pain This list may not describe all possible side effects. Call your doctor for medical advice about side effects. You may report side effects to FDA at 1-800-FDA-1088. Where should I keep my medicine? This drug is given in a hospital or clinic and will not be stored at home. NOTE: This sheet is a summary. It may not cover all possible information. If you have questions  about this medicine, talk to your doctor, pharmacist, or health care provider.  2012, [employer] Standard. (06/26/2008 5:22:47 PM)Carboplatin injection What is this medicine? CARBOPLATIN (KAR boe pla tin) is a chemotherapy drug. It targets fast dividing cells, like cancer cells, and causes these cells to die. This medicine is used to treat ovarian cancer and many other cancers. This medicine may be used for other purposes; ask your health care provider or pharmacist if you have questions. What should I tell my health care provider before I take this medicine? They need to know if you have any of these  conditions: -blood disorders -hearing problems -kidney disease -recent or ongoing radiation therapy -an unusual or allergic reaction to carboplatin, cisplatin, other chemotherapy, other medicines, foods, dyes, or preservatives -pregnant or trying to get pregnant -breast-feeding How should I use this medicine? This drug is usually given as an infusion into a vein. It is administered in a hospital or clinic by a specially trained health care professional. Talk to your pediatrician regarding the use of this medicine in children. Special care may be needed. Overdosage: If you think you have taken too much of this medicine contact a poison control center or emergency room at once. NOTE: This medicine is only for you. Do not share this medicine with others. What if I miss a dose? It is important not to miss a dose. Call your doctor or health care professional if you are unable to keep an appointment. What may interact with this medicine? -medicines for seizures -medicines to increase blood counts like filgrastim, pegfilgrastim, sargramostim -some antibiotics like amikacin, gentamicin, neomycin, streptomycin, tobramycin -vaccines Talk to your doctor or health care professional before taking any of these medicines: -acetaminophen -aspirin -ibuprofen -ketoprofen -naproxen This list may not describe all possible interactions. Give your health care provider a list of all the medicines, herbs, non-prescription drugs, or dietary supplements you use. Also tell them if you smoke, drink alcohol, or use illegal drugs. Some items may interact with your medicine. What should I watch for while using this medicine? Your condition will be monitored carefully while you are receiving this medicine. You will need important blood work done while you are taking this medicine. This drug may make you feel generally unwell. This is not uncommon, as chemotherapy can affect healthy cells as well as cancer cells. Report  any side effects. Continue your course of treatment even though you feel ill unless your doctor tells you to stop. In some cases, you may be given additional medicines to help with side effects. Follow all directions for their use. Call your doctor or health care professional for advice if you get a fever, chills or sore throat, or other symptoms of a cold or flu. Do not treat yourself. This drug decreases your body's ability to fight infections. Try to avoid being around people who are sick. This medicine may increase your risk to bruise or bleed. Call your doctor or health care professional if you notice any unusual bleeding. Be careful brushing and flossing your teeth or using a toothpick because you may get an infection or bleed more easily. If you have any dental work done, tell your dentist you are receiving this medicine. Avoid taking products that contain aspirin, acetaminophen, ibuprofen, naproxen, or ketoprofen unless instructed by your doctor. These medicines may hide a fever. Do not become pregnant while taking this medicine. Women should inform their doctor if they wish to become pregnant or think they might be pregnant. There is a potential  for serious side effects to an unborn child. Talk to your health care professional or pharmacist for more information. Do not breast-feed an infant while taking this medicine. What side effects may I notice from receiving this medicine? Side effects that you should report to your doctor or health care professional as soon as possible: -allergic reactions like skin rash, itching or hives, swelling of the face, lips, or tongue -signs of infection - fever or chills, cough, sore throat, pain or difficulty passing urine -signs of decreased platelets or bleeding - bruising, pinpoint red spots on the skin, black, tarry stools, nosebleeds -signs of decreased red blood cells - unusually weak or tired, fainting spells, lightheadedness -breathing  problems -changes in hearing -changes in vision -chest pain -high blood pressure -low blood counts - This drug may decrease the number of white blood cells, red blood cells and platelets. You may be at increased risk for infections and bleeding. -nausea and vomiting -pain, swelling, redness or irritation at the injection site -pain, tingling, numbness in the hands or feet -problems with balance, talking, walking -trouble passing urine or change in the amount of urine Side effects that usually do not require medical attention (report to your doctor or health care professional if they continue or are bothersome): -hair loss -loss of appetite -metallic taste in the mouth or changes in taste This list may not describe all possible side effects. Call your doctor for medical advice about side effects. You may report side effects to FDA at 1-800-FDA-1088. Where should I keep my medicine? This drug is given in a hospital or clinic and will not be stored at home. NOTE: This sheet is a summary. It may not cover all possible information. If you have questions about this medicine, talk to your doctor, pharmacist, or health care provider.  2012, [employer] Standard. (03/06/2008 2:38:05 PM)

## 2012-06-21 ENCOUNTER — Ambulatory Visit (HOSPITAL_BASED_OUTPATIENT_CLINIC_OR_DEPARTMENT_OTHER): Payer: Medicare Other

## 2012-06-21 VITALS — BP 101/57 | HR 78 | Temp 98.4°F

## 2012-06-21 DIAGNOSIS — C3490 Malignant neoplasm of unspecified part of unspecified bronchus or lung: Secondary | ICD-10-CM

## 2012-06-21 DIAGNOSIS — C3410 Malignant neoplasm of upper lobe, unspecified bronchus or lung: Secondary | ICD-10-CM

## 2012-06-21 DIAGNOSIS — Z5111 Encounter for antineoplastic chemotherapy: Secondary | ICD-10-CM

## 2012-06-21 MED ORDER — PROCHLORPERAZINE MALEATE 10 MG PO TABS
10.0000 mg | ORAL_TABLET | Freq: Once | ORAL | Status: AC
  Administered 2012-06-21: 10 mg via ORAL

## 2012-06-21 MED ORDER — SODIUM CHLORIDE 0.9 % IV SOLN
100.0000 mg/m2 | Freq: Once | INTRAVENOUS | Status: AC
  Administered 2012-06-21: 180 mg via INTRAVENOUS
  Filled 2012-06-21: qty 9

## 2012-06-21 MED ORDER — SODIUM CHLORIDE 0.9 % IV SOLN
Freq: Once | INTRAVENOUS | Status: AC
  Administered 2012-06-21: 15:00:00 via INTRAVENOUS

## 2012-06-21 NOTE — Patient Instructions (Signed)
[facility] Discharge Instructions for Patients Receiving Chemotherapy  Today you received the following chemotherapy agents Etoposide  To help prevent nausea and vomiting after your treatment, we encourage you to take your nausea medication as prescribed.   If you develop nausea and vomiting that is not controlled by your nausea medication, call the clinic. If it is after clinic hours your family physician or the after hours number for the clinic or go to the Emergency Department.   BELOW ARE SYMPTOMS THAT SHOULD BE REPORTED IMMEDIATELY:  *FEVER GREATER THAN 100.5 F  *CHILLS WITH OR WITHOUT FEVER  NAUSEA AND VOMITING THAT IS NOT CONTROLLED WITH YOUR NAUSEA MEDICATION  *UNUSUAL SHORTNESS OF BREATH  *UNUSUAL BRUISING OR BLEEDING  TENDERNESS IN MOUTH AND THROAT WITH OR WITHOUT PRESENCE OF ULCERS  *URINARY PROBLEMS  *BOWEL PROBLEMS  UNUSUAL RASH Items with * indicate a potential emergency and should be followed up as soon as possible.  One of the nurses will contact you 24 hours after your treatment. Please let the nurse know about any problems that you may have experienced. Feel free to call the clinic you have any questions or concerns. The clinic phone number is [phone].   I have been informed and understand all the instructions given to me. I know to contact the clinic, my physician, or go to the Emergency Department if any problems should occur. I do not have any questions at this time, but understand that I may call the clinic during office hours   should I have any questions or need assistance in obtaining follow up care.    __________________________________________  _____________  __________ Signature of Patient or Authorized Representative            Date                   Time    __________________________________________ Nurse's Signature    

## 2012-06-22 ENCOUNTER — Telehealth: Payer: Self-pay | Admitting: *Deleted

## 2012-06-22 ENCOUNTER — Ambulatory Visit (HOSPITAL_BASED_OUTPATIENT_CLINIC_OR_DEPARTMENT_OTHER): Payer: Medicare Other

## 2012-06-22 VITALS — BP 111/75 | HR 81 | Temp 97.6°F

## 2012-06-22 DIAGNOSIS — C3490 Malignant neoplasm of unspecified part of unspecified bronchus or lung: Secondary | ICD-10-CM

## 2012-06-22 DIAGNOSIS — Z5111 Encounter for antineoplastic chemotherapy: Secondary | ICD-10-CM

## 2012-06-22 DIAGNOSIS — C3410 Malignant neoplasm of upper lobe, unspecified bronchus or lung: Secondary | ICD-10-CM

## 2012-06-22 MED ORDER — SODIUM CHLORIDE 0.9 % IV SOLN
Freq: Once | INTRAVENOUS | Status: AC
  Administered 2012-06-22: 16:00:00 via INTRAVENOUS

## 2012-06-22 MED ORDER — SODIUM CHLORIDE 0.9 % IV SOLN
100.0000 mg/m2 | Freq: Once | INTRAVENOUS | Status: AC
  Administered 2012-06-22: 180 mg via INTRAVENOUS
  Filled 2012-06-22: qty 9

## 2012-06-22 MED ORDER — PROCHLORPERAZINE MALEATE 10 MG PO TABS
10.0000 mg | ORAL_TABLET | Freq: Once | ORAL | Status: AC
  Administered 2012-06-22: 10 mg via ORAL

## 2012-06-22 NOTE — Patient Instructions (Signed)
[facility] Discharge Instructions for Patients Receiving Chemotherapy  Today you received the following chemotherapy agents Etoposide  To help prevent nausea and vomiting after your treatment, we encourage you to take your nausea medication as prescribed.   If you develop nausea and vomiting that is not controlled by your nausea medication, call the clinic. If it is after clinic hours your family physician or the after hours number for the clinic or go to the Emergency Department.   BELOW ARE SYMPTOMS THAT SHOULD BE REPORTED IMMEDIATELY:  *FEVER GREATER THAN 100.5 F  *CHILLS WITH OR WITHOUT FEVER  NAUSEA AND VOMITING THAT IS NOT CONTROLLED WITH YOUR NAUSEA MEDICATION  *UNUSUAL SHORTNESS OF BREATH  *UNUSUAL BRUISING OR BLEEDING  TENDERNESS IN MOUTH AND THROAT WITH OR WITHOUT PRESENCE OF ULCERS  *URINARY PROBLEMS  *BOWEL PROBLEMS  UNUSUAL RASH Items with * indicate a potential emergency and should be followed up as soon as possible.  One of the nurses will contact you 24 hours after your treatment. Please let the nurse know about any problems that you may have experienced. Feel free to call the clinic you have any questions or concerns. The clinic phone number is [phone].   I have been informed and understand all the instructions given to me. I know to contact the clinic, my physician, or go to the Emergency Department if any problems should occur. I do not have any questions at this time, but understand that I may call the clinic during office hours   should I have any questions or need assistance in obtaining follow up care.    __________________________________________  _____________  __________ Signature of Patient or Authorized Representative            Date                   Time    __________________________________________ Nurse's Signature    

## 2012-06-22 NOTE — Telephone Encounter (Signed)
Pt called wanting to know results of CT of the head done on 7/3, per Dr Donnald Garre, results were negative, okay to inform pt.  Called and spoke to pt's wife, she verbalized understanding.  SLJ

## 2012-06-23 ENCOUNTER — Ambulatory Visit (HOSPITAL_BASED_OUTPATIENT_CLINIC_OR_DEPARTMENT_OTHER): Payer: Medicare Other

## 2012-06-23 ENCOUNTER — Telehealth: Payer: Self-pay | Admitting: Medical Oncology

## 2012-06-23 ENCOUNTER — Other Ambulatory Visit: Payer: Self-pay | Admitting: Medical Oncology

## 2012-06-23 VITALS — BP 117/71 | HR 92 | Temp 97.6°F

## 2012-06-23 DIAGNOSIS — R630 Anorexia: Secondary | ICD-10-CM

## 2012-06-23 DIAGNOSIS — C3490 Malignant neoplasm of unspecified part of unspecified bronchus or lung: Secondary | ICD-10-CM

## 2012-06-23 DIAGNOSIS — C3410 Malignant neoplasm of upper lobe, unspecified bronchus or lung: Secondary | ICD-10-CM

## 2012-06-23 MED ORDER — PEGFILGRASTIM INJECTION 6 MG/0.6ML
6.0000 mg | Freq: Once | SUBCUTANEOUS | Status: AC
  Administered 2012-06-23: 6 mg via SUBCUTANEOUS
  Filled 2012-06-23: qty 0.6

## 2012-06-23 MED ORDER — METHYLPREDNISOLONE 4 MG PO KIT: PACK | ORAL | Status: DC

## 2012-06-23 NOTE — Progress Notes (Signed)
Called in medrol dose pack to [facility]

## 2012-06-23 NOTE — Telephone Encounter (Signed)
Called in a medrol dose pack

## 2012-06-23 NOTE — Telephone Encounter (Signed)
Requests a " prescription to help him eat"

## 2012-06-27 ENCOUNTER — Telehealth: Payer: Self-pay | Admitting: Internal Medicine

## 2012-06-27 ENCOUNTER — Encounter: Payer: Self-pay | Admitting: Physician Assistant

## 2012-06-27 ENCOUNTER — Other Ambulatory Visit: Payer: Medicare Other | Admitting: Lab

## 2012-06-27 ENCOUNTER — Ambulatory Visit (HOSPITAL_BASED_OUTPATIENT_CLINIC_OR_DEPARTMENT_OTHER): Payer: Medicare Other | Admitting: Physician Assistant

## 2012-06-27 VITALS — BP 101/68 | HR 88 | Temp 98.8°F | Ht 71.5 in | Wt 138.4 lb

## 2012-06-27 DIAGNOSIS — C3490 Malignant neoplasm of unspecified part of unspecified bronchus or lung: Secondary | ICD-10-CM

## 2012-06-27 LAB — CBC WITH DIFFERENTIAL/PLATELET
Basophils Absolute: 0 10*3/uL (ref 0.0–0.1)
Eosinophils Absolute: 0 10*3/uL (ref 0.0–0.5)
HGB: 13.2 g/dL (ref 13.0–17.1)
MCV: 92.9 fL (ref 79.3–98.0)
MONO%: 4.5 % (ref 0.0–14.0)
NEUT#: 2.7 10*3/uL (ref 1.5–6.5)
Platelets: 144 10*3/uL (ref 140–400)
RDW: 13.6 % (ref 11.0–14.6)

## 2012-06-27 LAB — COMPREHENSIVE METABOLIC PANEL
Albumin: 4.1 g/dL (ref 3.5–5.2)
Alkaline Phosphatase: 180 U/L — ABNORMAL HIGH (ref 39–117)
BUN: 22 mg/dL (ref 6–23)
Calcium: 9.1 mg/dL (ref 8.4–10.5)
Glucose, Bld: 81 mg/dL (ref 70–99)
Potassium: 4.5 mEq/L (ref 3.5–5.3)

## 2012-06-27 NOTE — Telephone Encounter (Signed)
gv pt appt schedule for July/August. S/w Britta Mccreedy (nurse - triage) @ [facility] today and pt will see Dr. Johna Sheriff this Friday 7/19 @ 3:30 pm to arrive 3 pm. Port placement will be discussed with pt on Friday. AJ wanted port placed this wk or at least Monday after labs are repeat if possible. Per Britta Mccreedy she can't promise Monday but they will discuss this w/pt at visit Friday. Pt aware of above. AJ aware.

## 2012-06-28 NOTE — Progress Notes (Signed)
No images are attached to the encounter. No scans are attached to the encounter. No scans are attached to the encounter. [facility] OFFICE PROGRESS NOTE  Lillia Mountain, MD [address] [employer], [place]. [place] [address]  DIAGNOSIS: Extensive stage small cell lung cancer  PRIOR THERAPY: None  CURRENT THERAPY: Systemic chemotherapy with carboplatin for an AUC of 5 given on day 1 and etoposide at 100 mg per meter squared given on days one 2 and 3 with Neulasta support given on day 4 status post 1 cycle  INTERVAL HISTORY: Anthony [name] 61 y.o. male returns for a scheduled regular symptom management visit for followup of his extensive stage small cell lung cancer after receiving his first cycle of systemic chemotherapy with carboplatin and etoposide with Neulasta support. He reports continued problems with his appetite. He did have difficulty with nausea although it was well-controlled with Compazine. He said some episodes of diarrhea but he has a history of Crohn's disease and has a colostomy. He did not take the Medrol Dosepak for appetite stimulation as he has a long history of steroid use related to his Crohn's disease and steroids tend to aggravate his Crohn's at this point. He also states that he has a history of a pulmonary emboli in his 14s. He is been very judiciously using the narcotic pain medication as the narcotics. His Crohn's is well, causing constipation. He is having difficulty with peripheral venous access and is interested in having a Port-A-Cath placed. He was previously seen by Dr. Sharlette Dense. He reports appetite is fair and denied fever or chills. He denied any significant shortness of breath or chest pain cough or hemoptysis.   MEDICAL HISTORY: Past Medical History  Diagnosis Date  . Crohn's disease of colon   . Pulmonary emboli   . COPD (chronic obstructive pulmonary disease)   . Emphysema   . Seizure disorder   .  Kidney mass   . Seizures     ALLERGIES:   has no known allergies.  MEDICATIONS:  Current Outpatient Prescriptions  Medication Sig Dispense Refill  . budesonide-formoterol (SYMBICORT) 160-4.5 MCG/ACT inhaler Inhale 2 puffs into the lungs 2 (two) times daily.      . Calcium Carbonate-Vitamin D (CALCIUM 600 + D PO) Take 1 tablet by mouth daily.      . cyanocobalamin 1000 MCG tablet Take by mouth daily. once monthly.      . Hydrocodone-Acetaminophen 10-300 MG TABS 1-2 tabs daily prn      . LORazepam (ATIVAN) 1 MG tablet Take 1 tablet by mouth Three times a day.      . methylPREDNISolone (MEDROL, PAK,) 4 MG tablet follow package directions  21 tablet  0  . phenytoin (DILANTIN) 100 MG ER capsule Take 2 capsules by mouth Twice daily.      . phenytoin (DILANTIN) 30 MG ER capsule Take 30 mg by mouth 2 (two) times daily.      Marland Kitchen PROAIR HFA 108 (90 BASE) MCG/ACT inhaler Take 2 puffs by mouth Every 4 hours as needed.      . prochlorperazine (COMPAZINE) 10 MG tablet Take 1 tablet (10 mg total) by mouth every 6 (six) hours as needed.  60 tablet  0  . promethazine (PHENERGAN) 25 MG tablet Take 1 tablet by mouth as needed.      . tiotropium (SPIRIVA) 18 MCG inhalation capsule Place 18 mcg into inhaler and inhale daily.  SURGICAL HISTORY:  Past Surgical History  Procedure Date  . Arm surgery     left  . Right knee   . Rectal surgery     x7  . Abdominal surgery     x3  . Video bronchoscopy 06/09/2012    Procedure: VIDEO BRONCHOSCOPY WITHOUT FLUORO;  Surgeon: Leslye Peer, MD;  Location: [facility];  Service: Cardiopulmonary;  Laterality: Bilateral;    REVIEW OF SYSTEMS:  Pertinent items are noted in HPI.   PHYSICAL EXAMINATION: General appearance: alert, cooperative, appears stated age and no distress Head: Normocephalic, without obvious abnormality, atraumatic Neck: no adenopathy, no carotid bruit, no JVD, supple, symmetrical, trachea midline and thyroid not enlarged,  symmetric, no tenderness/mass/nodules Lymph nodes: Cervical, supraclavicular, and axillary nodes normal. Resp: clear to auscultation bilaterally Cardio: regular rate and rhythm, S1, S2 normal, no murmur, click, rub or gallop GI: soft, non-tender; bowel sounds normal; no masses,  no organomegaly and Left lower quadrant colosttoomy Extremities: extremities normal, atraumatic, no cyanosis or edema Neurologic: Alert and oriented X 3, normal strength and tone. Normal symmetric reflexes. Normal coordination and gait  ECOG PERFORMANCE STATUS: 1 - Symptomatic but completely ambulatory  Blood pressure 101/68, pulse 88, temperature 98.8 F (37.1 C), temperature source Oral, height 5' 11.5" (1.816 m), weight 138 lb 6.4 oz (62.778 kg).  LABORATORY DATA: Lab Results  Component Value Date   WBC 4.2 06/27/2012   HGB 13.2 06/27/2012   HCT 39.3 06/27/2012   MCV 92.9 06/27/2012   PLT 144 06/27/2012      Chemistry      Component Value Date/Time   NA 137 06/27/2012 1420   K 4.5 06/27/2012 1420   CL 103 06/27/2012 1420   CO2 26 06/27/2012 1420   BUN 22 06/27/2012 1420   CREATININE 1.19 06/27/2012 1420      Component Value Date/Time   CALCIUM 9.1 06/27/2012 1420   ALKPHOS 180* 06/27/2012 1420   AST 14 06/27/2012 1420   ALT 15 06/27/2012 1420   BILITOT 0.4 06/27/2012 1420       RADIOGRAPHIC STUDIES:  Ct Head W Wo Contrast  06/19/12  *RADIOLOGY REPORT*  Clinical Data: New diagnosis lung cancer.  Headaches.  CT HEAD WITHOUT AND WITH CONTRAST  Technique:  Contiguous axial images were obtained from the base of the skull through the vertex without and with intravenous contrast.  Contrast: OMNIPAQUE IOHEXOL 300 MG/ML  SOLN  Comparison: None.  Findings: The brain has a normal appearance without evidence of atrophy, old or acute infarction, mass lesion, hemorrhage, hydrocephalus or extra-axial collection.  No abnormal enhancement occurs.  Visualized sinuses, middle ears and mastoids are clear. No calvarial  abnormality.  IMPRESSION: Normal examination.  No evidence of metastatic disease.  Original Report Authenticated By: Thomasenia Sales, M.D.   Ct Chest W Contrast  06/02/2012  *RADIOLOGY REPORT*  Clinical Data:  Rounded mass noted on chest x-ray, right upper quadrant pain, smoking history, cough and shortness of breath, elevated liver function tests, known Crohn's disease  CT CHEST, ABDOMEN AND PELVIS WITH CONTRAST  Technique:  Multidetector CT imaging of the chest, abdomen and pelvis was performed following the standard protocol during bolus administration of intravenous contrast.  Contrast: OMNIPAQUE IOHEXOL 300 MG/ML  SOLN  Comparison:  Abdomen films from yesterday.  CT CHEST  Findings:  On the lung window images, there are diffuse changes of centrilobular emphysema.  However there is a large right hilar mass of 7.1 x 4.8 x 5.3 cm consistent  with primary lung carcinoma. Also, there are poorly defined nodular opacities in the right upper lobe suspicious for metastases of 7 mm, 9 mm, and 5 mm in diameter. No pleural effusion is seen.  On soft tissue window images, there is a superior mediastinal paratracheal node present of 7 mm in short axis diameter.  A left paratracheal node near the carina measures 6 mm in short axis diameter.  Additional smaller nodes are present as well as the right hilar adenopathy in this patient with suspected primary lung carcinoma.  The thoracic aorta and pulmonary arteries opacify with no significant abnormality although the right lower lobe pulmonary artery courses through the lobular right hilar - right upper lobe mass.  No bony abnormality is seen.  IMPRESSION:  1.  Large right hilar mass extending into the right upper lobe consistent with primary lung carcinoma measuring 7.1 x 4.8 x 5.3 cm. 2.  Three poorly defined nodular lesions in the right upper lobe suspicious for metastases. 3.  Diffuse centrilobular emphysema. 4.  Mediastinal nodes none of which are pathologically  enlarged, but they are suspicious for metastatic involvement in view of the right hilar mass.  CT ABDOMEN AND PELVIS  Findings:  The liver enhances with no evidence of metastatic involvement.  There may be fatty infiltration with some sparing near the gallbladder.  There is a small calcified gallstone layering in the gallbladder dependently.  The pancreas is normal in size and the pancreatic duct is minimally prominent.  The adrenal glands and spleen are unremarkable.  The stomach is not optimally distended but no abnormality is noted.  The kidneys enhance and there are multiple bilateral renal cysts present.  However, there is a solid appearing mass in the medial mid upper left kidney of 3.0 x 2.5 x 2.9 cm consistent with either renal cell carcinoma or metastatic lesion.  There is also a 9 mm lesion in the anterior upper left kidney of 9 mm which may represent a complex cyst but a metastatic or primary lesion cannot be excluded.  The abdominal aorta is normal in caliber with atheromatous change present.  The mesenteric vasculature is patent.  No adenopathy is seen.  The urinary bladder is moderately distended and unremarkable.  The prostate is prominent measuring 4.4 x 4.2 cm.  The patient appears to have undergone resection of the rectosigmoid colon.  The remainder of the colon is unremarkable and a colostomy is noted in the left lower quadrant.  No pelvic mass or adenopathy is seen. Significant degenerative joint disease is present involving the right hip with lesser involvement of the left hip.  There is degenerative disc disease particularly at the L5 S1 level but also L3-4 and L4-5 levels.  IMPRESSION:  1.  3.0 cm medial left upper renal mass consistent with either renal cell carcinoma or metastatic lesion. 2.  Indeterminate 9 mm lesion in the upper anterior left kidney may represent complex cyst or primary versus metastatic lesion. 3.  No metastatic involvement of the liver is seen. 4.  No adenopathy is  noted. 5.  Prominent prostate.  Original Report Authenticated By: Juline Patch, M.D.   Ct Abdomen Pelvis W Contrast  06/02/2012  *RADIOLOGY REPORT*  Clinical Data:  Rounded mass noted on chest x-ray, right upper quadrant pain, smoking history, cough and shortness of breath, elevated liver function tests, known Crohn's disease  CT CHEST, ABDOMEN AND PELVIS WITH CONTRAST  Technique:  Multidetector CT imaging of the chest, abdomen and pelvis was performed following the  standard protocol during bolus administration of intravenous contrast.  Contrast: OMNIPAQUE IOHEXOL 300 MG/ML  SOLN  Comparison:  Abdomen films from yesterday.  CT CHEST  Findings:  On the lung window images, there are diffuse changes of centrilobular emphysema.  However there is a large right hilar mass of 7.1 x 4.8 x 5.3 cm consistent with primary lung carcinoma. Also, there are poorly defined nodular opacities in the right upper lobe suspicious for metastases of 7 mm, 9 mm, and 5 mm in diameter. No pleural effusion is seen.  On soft tissue window images, there is a superior mediastinal paratracheal node present of 7 mm in short axis diameter.  A left paratracheal node near the carina measures 6 mm in short axis diameter.  Additional smaller nodes are present as well as the right hilar adenopathy in this patient with suspected primary lung carcinoma.  The thoracic aorta and pulmonary arteries opacify with no significant abnormality although the right lower lobe pulmonary artery courses through the lobular right hilar - right upper lobe mass.  No bony abnormality is seen.  IMPRESSION:  1.  Large right hilar mass extending into the right upper lobe consistent with primary lung carcinoma measuring 7.1 x 4.8 x 5.3 cm. 2.  Three poorly defined nodular lesions in the right upper lobe suspicious for metastases. 3.  Diffuse centrilobular emphysema. 4.  Mediastinal nodes none of which are pathologically enlarged, but they are suspicious for metastatic  involvement in view of the right hilar mass.  CT ABDOMEN AND PELVIS  Findings:  The liver enhances with no evidence of metastatic involvement.  There may be fatty infiltration with some sparing near the gallbladder.  There is a small calcified gallstone layering in the gallbladder dependently.  The pancreas is normal in size and the pancreatic duct is minimally prominent.  The adrenal glands and spleen are unremarkable.  The stomach is not optimally distended but no abnormality is noted.  The kidneys enhance and there are multiple bilateral renal cysts present.  However, there is a solid appearing mass in the medial mid upper left kidney of 3.0 x 2.5 x 2.9 cm consistent with either renal cell carcinoma or metastatic lesion.  There is also a 9 mm lesion in the anterior upper left kidney of 9 mm which may represent a complex cyst but a metastatic or primary lesion cannot be excluded.  The abdominal aorta is normal in caliber with atheromatous change present.  The mesenteric vasculature is patent.  No adenopathy is seen.  The urinary bladder is moderately distended and unremarkable.  The prostate is prominent measuring 4.4 x 4.2 cm.  The patient appears to have undergone resection of the rectosigmoid colon.  The remainder of the colon is unremarkable and a colostomy is noted in the left lower quadrant.  No pelvic mass or adenopathy is seen. Significant degenerative joint disease is present involving the right hip with lesser involvement of the left hip.  There is degenerative disc disease particularly at the L5 S1 level but also L3-4 and L4-5 levels.  IMPRESSION:  1.  3.0 cm medial left upper renal mass consistent with either renal cell carcinoma or metastatic lesion. 2.  Indeterminate 9 mm lesion in the upper anterior left kidney may represent complex cyst or primary versus metastatic lesion. 3.  No metastatic involvement of the liver is seen. 4.  No adenopathy is noted. 5.  Prominent prostate.  Original Report  Authenticated By: Juline Patch, M.D.     ASSESSMENT/PLAN:  The patient is a pleasant 61 year-old white male recently diagnosed with extensive stage small cell lung cancer, now status post one cycle of systemic chemotherapy with carboplatin for an AUC of 5 given on day 1 and etoposide 100 mg meter square given on days one 2 and 3 with Neulasta support given on day 4. Patient was discussed with Dr. Arbutus Ped. We will refer him to Dr. Dillard Essex for a Port-A-Cath. He will continue with weekly labs consisting of a CBC differential and C. met. He'll return in 2 weeks prior to cycle #2 with a repeat CBC differential and C. met.     Anthony [name], Anthony Stratmann E, PA-C     All questions were answered. The patient knows to call the clinic with any problems, questions or concerns. We can certainly see the patient much sooner if necessary.  I spent 20 minutes counseling the patient face to face. The total time spent in the appointment was 30 minutes.

## 2012-07-01 ENCOUNTER — Encounter (INDEPENDENT_AMBULATORY_CARE_PROVIDER_SITE_OTHER): Payer: Self-pay | Admitting: General Surgery

## 2012-07-01 ENCOUNTER — Ambulatory Visit (INDEPENDENT_AMBULATORY_CARE_PROVIDER_SITE_OTHER): Payer: Medicare Other | Admitting: General Surgery

## 2012-07-01 VITALS — BP 104/60 | HR 68 | Temp 97.2°F | Resp 14 | Ht 71.0 in | Wt 136.5 lb

## 2012-07-01 DIAGNOSIS — C3490 Malignant neoplasm of unspecified part of unspecified bronchus or lung: Secondary | ICD-10-CM

## 2012-07-01 DIAGNOSIS — IMO0001 Reserved for inherently not codable concepts without codable children: Secondary | ICD-10-CM

## 2012-07-01 DIAGNOSIS — C801 Malignant (primary) neoplasm, unspecified: Secondary | ICD-10-CM

## 2012-07-01 NOTE — Progress Notes (Signed)
Chief complaint: Lung cancer and need for venous access  History: Patient is a 61-year-old male with a recent diagnosis of small cell cancer of the lung. He has started chemotherapy but has had difficulty with venous access and is referred for placement of Port-A-Cath. He has had a central line on the left in the past but no other central IV access. Past Medical History  Diagnosis Date  . Crohn's disease of colon   . Pulmonary emboli   . COPD (chronic obstructive pulmonary disease)   . Emphysema   . Seizure disorder   . Kidney mass   . Seizures   . Arthritis   . Cancer   . Emphysema of lung   . Weight loss, unintentional   . Cough   . Chest pain   . Weakness    Past Surgical History  Procedure Date  . Arm surgery     left  . Right knee   . Rectal surgery     x7  . Abdominal surgery     x3  . Video bronchoscopy 06/09/2012    Procedure: VIDEO BRONCHOSCOPY WITHOUT FLUORO;  Surgeon: Robert S Byrum, MD;  Location: [facility];  Service: Cardiopulmonary;  Laterality: Bilateral;   Current Outpatient Prescriptions  Medication Sig Dispense Refill  . budesonide-formoterol (SYMBICORT) 160-4.5 MCG/ACT inhaler Inhale 2 puffs into the lungs 2 (two) times daily.      . Calcium Carbonate-Vitamin D (CALCIUM 600 + D PO) Take 1 tablet by mouth daily.      . cyanocobalamin 1000 MCG tablet Take by mouth daily. 1000mcg once monthly.      . Hydrocodone-Acetaminophen 10-300 MG TABS 1-2 tabs daily prn      . LORazepam (ATIVAN) 1 MG tablet Take 1 tablet by mouth Three times a day.      . phenytoin (DILANTIN) 100 MG ER capsule Take 2 capsules by mouth Twice daily.      . phenytoin (DILANTIN) 30 MG ER capsule Take 30 mg by mouth 2 (two) times daily.      . PROAIR HFA 108 (90 BASE) MCG/ACT inhaler Take 2 puffs by mouth Every 4 hours as needed.      . promethazine (PHENERGAN) 25 MG tablet Take 1 tablet by mouth as needed.      . tiotropium (SPIRIVA) 18 MCG inhalation capsule Place 18 mcg into inhaler  and inhale daily.       No Known Allergies  Exam: General: Thin alert white male in no distress HEENT: No palpable masses. Lymph nodes: No cervical or supraclavicular or axillary nodes palpable Lungs: Clear equal breath sounds bilaterally Extremities: No edema or deformity  Assessment and plan: recent diagnosis of lung cancer with need for central venous access. I discussed Port-A-Cath placement with the patient. We discussed indications and nature of the procedure as well as risks of bleeding, infection, pneumothorax, infection, and catheter displacement. We will try to get this scheduled prior to his next treatment. 

## 2012-07-04 ENCOUNTER — Other Ambulatory Visit: Payer: Medicare Other | Admitting: Lab

## 2012-07-04 ENCOUNTER — Other Ambulatory Visit (HOSPITAL_BASED_OUTPATIENT_CLINIC_OR_DEPARTMENT_OTHER): Payer: Medicare Other

## 2012-07-04 ENCOUNTER — Encounter (HOSPITAL_COMMUNITY): Payer: Self-pay | Admitting: *Deleted

## 2012-07-04 ENCOUNTER — Ambulatory Visit: Payer: Medicare Other | Admitting: Nutrition

## 2012-07-04 ENCOUNTER — Telehealth (INDEPENDENT_AMBULATORY_CARE_PROVIDER_SITE_OTHER): Payer: Self-pay

## 2012-07-04 DIAGNOSIS — C3490 Malignant neoplasm of unspecified part of unspecified bronchus or lung: Secondary | ICD-10-CM

## 2012-07-04 LAB — CBC WITH DIFFERENTIAL/PLATELET
Basophils Absolute: 0.1 10*3/uL (ref 0.0–0.1)
EOS%: 0.3 % (ref 0.0–7.0)
Eosinophils Absolute: 0 10*3/uL (ref 0.0–0.5)
HGB: 12.1 g/dL — ABNORMAL LOW (ref 13.0–17.1)
MCH: 30.5 pg (ref 27.2–33.4)
NEUT#: 11.7 10*3/uL — ABNORMAL HIGH (ref 1.5–6.5)
RBC: 3.97 10*6/uL — ABNORMAL LOW (ref 4.20–5.82)
RDW: 13.3 % (ref 11.0–14.6)
lymph#: 2.3 10*3/uL (ref 0.9–3.3)

## 2012-07-04 MED ORDER — CHLORHEXIDINE GLUCONATE 4 % EX LIQD: 1.0000 "application " | Freq: Once | CUTANEOUS | Status: DC

## 2012-07-04 MED ORDER — CEFAZOLIN SODIUM-DEXTROSE 2-3 GM-% IV SOLR
2.0000 g | INTRAVENOUS | Status: AC
  Administered 2012-07-05: 2 g via INTRAVENOUS
  Filled 2012-07-04: qty 50

## 2012-07-04 NOTE — Assessment & Plan Note (Signed)
Anthony [name] is a 61 year old male patient of Dr. Asa Lente diagnosed with small cell lung cancer receiving chemotherapy.    History includes Crohn's disease, COPD, colostomy, pulmonary emboli and seizure.    Medications include calcium, vitamin D, Ativan and Phenergan.    Labs were reviewed.  Height is 71-1/2 inches, weight 136.5 pounds July 19, usual body weight 142 pounds documented June 25.  BMI 19.05.    The patient reports poor appetite and nausea however nausea was controlled with his nausea medication.  He reports he is feeling better today.  He expresses a lot of anxiety about potentially having nausea and vomiting from chemotherapy.  However, states that he understands that he has a choice and he is in control of whether chemotherapy continues or not.  He is drinking 4 regular Ensure daily.  The patient has the ability to receive Ensure at a discounted rate, and he likes it well enough that he is able to drink 4 daily.  He does not consume many dairy products secondary to his Crohn's disease.  He does eat random foods based on how he feels and how his Crohn's is affecting him.  NUTRITION DIAGNOSIS:  Unintended weight loss related to diagnosis of small cell lung cancer and associated treatments as evidenced by 4% weight loss in 1 month.  INTERVENTION:  I have educated Mr. and Anthony [name] on the importance of smaller, more frequent meals utilizing high-calorie, high-protein foods. I have encouraged him to continue regular Ensure q.i.d. or Ensure Plus t.i.d. between and with meals as desired.  If he is feeling like he cannot eat very much then he is to consume Ensure with his meals.  I provided information about higher calorie, higher protein foods and educated him on strategies for dealing with nausea.  I provided fact sheets and samples for the patient to take with him today and I have answered his questions.  MONITORING/EVALUATION (GOALS):  That the patient will tolerate oral diet to minimize  weight loss throughout treatment.  NEXT VISIT:  Wednesday, July 31 during chemotherapy.  ______________________________ Zenovia Jarred, RD, CSO, LDN Clinical Nutrition Specialist BN/MEDQ  D:  07/04/2012  T:  07/04/2012  Job:  [id number]

## 2012-07-04 NOTE — Telephone Encounter (Signed)
The pt called to report his wct of 14.8 from this am.  It has come up.  He is scheduled for a portacath placement tomorrow.  I paged Dr Johna Sheriff.  He said as long as there are no other symptoms like fever it is ok to proceed.  I called the pt back and he has no symptoms so I told him he can proceed.

## 2012-07-05 ENCOUNTER — Encounter (HOSPITAL_COMMUNITY): Payer: Self-pay | Admitting: Anesthesiology

## 2012-07-05 ENCOUNTER — Ambulatory Visit (HOSPITAL_COMMUNITY): Payer: Medicare Other

## 2012-07-05 ENCOUNTER — Ambulatory Visit (HOSPITAL_COMMUNITY): Payer: Medicare Other | Admitting: Anesthesiology

## 2012-07-05 ENCOUNTER — Encounter (HOSPITAL_COMMUNITY): Payer: Self-pay | Admitting: Pharmacy Technician

## 2012-07-05 ENCOUNTER — Encounter (HOSPITAL_COMMUNITY)
Admission: RE | Disposition: A | Discharge status: Home / Self Care | Payer: Self-pay | Source: Ambulatory Visit | Attending: General Surgery

## 2012-07-05 ENCOUNTER — Ambulatory Visit (HOSPITAL_COMMUNITY)
Admission: RE | Admit: 2012-07-05 | Discharge: 2012-07-05 | Disposition: A | Discharge status: Home / Self Care | Payer: Medicare Other | Source: Ambulatory Visit | Attending: General Surgery | Admitting: General Surgery

## 2012-07-05 DIAGNOSIS — J438 Other emphysema: Secondary | ICD-10-CM | POA: Insufficient documentation

## 2012-07-05 DIAGNOSIS — C3490 Malignant neoplasm of unspecified part of unspecified bronchus or lung: Secondary | ICD-10-CM | POA: Insufficient documentation

## 2012-07-05 DIAGNOSIS — C50929 Malignant neoplasm of unspecified site of unspecified male breast: Secondary | ICD-10-CM

## 2012-07-05 HISTORY — PX: PORTACATH PLACEMENT: SHX2246

## 2012-07-05 LAB — COMPREHENSIVE METABOLIC PANEL
AST: 15 U/L (ref 0–37)
Albumin: 3.7 g/dL (ref 3.5–5.2)
BUN: 9 mg/dL (ref 6–23)
Calcium: 8.8 mg/dL (ref 8.4–10.5)
Chloride: 104 mEq/L (ref 96–112)
Potassium: 3.8 mEq/L (ref 3.5–5.3)
Sodium: 143 mEq/L (ref 135–145)
Total Protein: 6.3 g/dL (ref 6.0–8.3)

## 2012-07-05 LAB — SURGICAL PCR SCREEN: MRSA, PCR: NEGATIVE

## 2012-07-05 SURGERY — INSERTION, TUNNELED CENTRAL VENOUS DEVICE, WITH PORT
Anesthesia: Monitor Anesthesia Care | Site: Chest | Wound class: Clean

## 2012-07-05 MED ORDER — MUPIROCIN 2 % EX OINT
TOPICAL_OINTMENT | CUTANEOUS | Status: AC
  Administered 2012-07-05: 1 via NASAL
  Filled 2012-07-05: qty 22

## 2012-07-05 MED ORDER — HEPARIN SOD (PORK) LOCK FLUSH 100 UNIT/ML IV SOLN
INTRAVENOUS | Status: AC
  Filled 2012-07-05: qty 5

## 2012-07-05 MED ORDER — BUPIVACAINE-EPINEPHRINE 0.25% -1:200000 IJ SOLN
INTRAMUSCULAR | Status: DC | PRN
  Administered 2012-07-05: 30 mL

## 2012-07-05 MED ORDER — HEPARIN SOD (PORK) LOCK FLUSH 100 UNIT/ML IV SOLN
INTRAVENOUS | Status: DC | PRN
  Administered 2012-07-05: 500 [IU] via INTRAVENOUS

## 2012-07-05 MED ORDER — SODIUM CHLORIDE 0.9 % IR SOLN
Status: DC | PRN
  Administered 2012-07-05: 09:00:00

## 2012-07-05 MED ORDER — SODIUM BICARBONATE 4 % IV SOLN
INTRAVENOUS | Status: AC
  Filled 2012-07-05: qty 5

## 2012-07-05 MED ORDER — FENTANYL CITRATE 0.05 MG/ML IJ SOLN
INTRAMUSCULAR | Status: DC | PRN
  Administered 2012-07-05: 100 ug via INTRAVENOUS
  Administered 2012-07-05: 50 ug via INTRAVENOUS

## 2012-07-05 MED ORDER — ALBUTEROL SULFATE HFA 108 (90 BASE) MCG/ACT IN AERS
INHALATION_SPRAY | RESPIRATORY_TRACT | Status: DC | PRN
  Administered 2012-07-05: 4 via RESPIRATORY_TRACT

## 2012-07-05 MED ORDER — MIDAZOLAM HCL 5 MG/5ML IJ SOLN
INTRAMUSCULAR | Status: DC | PRN
  Administered 2012-07-05 (×2): 2 mg via INTRAVENOUS

## 2012-07-05 MED ORDER — MUPIROCIN 2 % EX OINT
TOPICAL_OINTMENT | Freq: Once | CUTANEOUS | Status: AC
  Administered 2012-07-05: 1 via NASAL
  Filled 2012-07-05: qty 22

## 2012-07-05 MED ORDER — SODIUM BICARBONATE 4 % IV SOLN
INTRAVENOUS | Status: DC | PRN
  Administered 2012-07-05: 5 mL via INTRAVENOUS

## 2012-07-05 MED ORDER — ONDANSETRON HCL 4 MG/2ML IJ SOLN
INTRAMUSCULAR | Status: DC | PRN
  Administered 2012-07-05: 4 mg via INTRAVENOUS

## 2012-07-05 MED ORDER — HYDROMORPHONE HCL PF 1 MG/ML IJ SOLN: 0.2500 mg | INTRAMUSCULAR | Status: DC | PRN

## 2012-07-05 MED ORDER — LIDOCAINE HCL (PF) 1 % IJ SOLN
INTRAMUSCULAR | Status: AC
  Filled 2012-07-05: qty 30

## 2012-07-05 MED ORDER — LIDOCAINE HCL (PF) 1 % IJ SOLN
INTRAMUSCULAR | Status: DC | PRN
  Administered 2012-07-05: 30 mL

## 2012-07-05 MED ORDER — BUPIVACAINE-EPINEPHRINE PF 0.25-1:200000 % IJ SOLN
INTRAMUSCULAR | Status: AC
  Filled 2012-07-05: qty 30

## 2012-07-05 MED ORDER — LACTATED RINGERS IV SOLN
INTRAVENOUS | Status: DC | PRN
  Administered 2012-07-05: 07:00:00 via INTRAVENOUS

## 2012-07-05 MED ORDER — PROPOFOL 10 MG/ML IV EMUL
INTRAVENOUS | Status: DC | PRN
  Administered 2012-07-05: 120 ug/kg/min via INTRAVENOUS

## 2012-07-05 MED ORDER — LIDOCAINE HCL (CARDIAC) 20 MG/ML IV SOLN
INTRAVENOUS | Status: DC | PRN
  Administered 2012-07-05: 30 mg via INTRAVENOUS

## 2012-07-05 MED ORDER — DROPERIDOL 2.5 MG/ML IJ SOLN: 0.6250 mg | INTRAMUSCULAR | Status: DC | PRN

## 2012-07-05 SURGICAL SUPPLY — 55 items
BAG DECANTER FOR FLEXI CONT (MISCELLANEOUS) ×2 IMPLANT
BLADE SURG 15 STRL LF DISP TIS (BLADE) ×1 IMPLANT
BLADE SURG 15 STRL SS (BLADE) ×1
CHLORAPREP W/TINT 10.5 ML (MISCELLANEOUS) ×2 IMPLANT
CLOTH BEACON ORANGE TIMEOUT ST (SAFETY) ×2 IMPLANT
COVER SURGICAL LIGHT HANDLE (MISCELLANEOUS) ×2 IMPLANT
CRADLE DONUT ADULT HEAD (MISCELLANEOUS) ×2 IMPLANT
DECANTER SPIKE VIAL GLASS SM (MISCELLANEOUS) IMPLANT
DERMABOND ADVANCED (GAUZE/BANDAGES/DRESSINGS) ×1
DERMABOND ADVANCED .7 DNX12 (GAUZE/BANDAGES/DRESSINGS) ×1 IMPLANT
DRAPE C-ARM 42X72 X-RAY (DRAPES) ×2 IMPLANT
DRAPE CHEST BREAST 15X10 FENES (DRAPES) ×2 IMPLANT
ELECT CAUTERY BLADE 6.4 (BLADE) ×2 IMPLANT
ELECT REM PT RETURN 9FT ADLT (ELECTROSURGICAL) ×2
ELECTRODE REM PT RTRN 9FT ADLT (ELECTROSURGICAL) ×1 IMPLANT
GAUZE SPONGE 4X4 16PLY XRAY LF (GAUZE/BANDAGES/DRESSINGS) ×2 IMPLANT
GLOVE BIOGEL PI IND STRL 6.5 (GLOVE) ×1 IMPLANT
GLOVE BIOGEL PI IND STRL 7.0 (GLOVE) ×1 IMPLANT
GLOVE BIOGEL PI IND STRL 8 (GLOVE) ×1 IMPLANT
GLOVE BIOGEL PI INDICATOR 6.5 (GLOVE) ×1
GLOVE BIOGEL PI INDICATOR 7.0 (GLOVE) ×1
GLOVE BIOGEL PI INDICATOR 8 (GLOVE) ×1
GLOVE SS BIOGEL STRL SZ 7.5 (GLOVE) ×1 IMPLANT
GLOVE SUPERSENSE BIOGEL SZ 7.5 (GLOVE) ×1
GLOVE SURG SS PI 6.5 STRL IVOR (GLOVE) ×2 IMPLANT
GOWN BRE IMP SLV AUR LG STRL (GOWN DISPOSABLE) ×2 IMPLANT
GOWN STRL NON-REIN LRG LVL3 (GOWN DISPOSABLE) ×2 IMPLANT
GOWN STRL REIN XL XLG (GOWN DISPOSABLE) ×2 IMPLANT
INTRODUCER COOK 11FR (CATHETERS) IMPLANT
IV CATH 14GX2 1/4 (CATHETERS) IMPLANT
KIT BASIN OR (CUSTOM PROCEDURE TRAY) ×2 IMPLANT
KIT PORT POWER 9.6FR MRI PREA (Catheter) IMPLANT
KIT PORT POWER ISP 8FR (Catheter) IMPLANT
KIT POWER CATH 8FR (Catheter) ×2 IMPLANT
KIT ROOM TURNOVER OR (KITS) ×2 IMPLANT
NEEDLE 22X1 1/2 (OR ONLY) (NEEDLE) ×2 IMPLANT
NEEDLE HYPO 25GX1X1/2 BEV (NEEDLE) ×2 IMPLANT
NS IRRIG 1000ML POUR BTL (IV SOLUTION) ×2 IMPLANT
PACK SURGICAL SETUP 50X90 (CUSTOM PROCEDURE TRAY) ×2 IMPLANT
PAD ARMBOARD 7.5X6 YLW CONV (MISCELLANEOUS) ×4 IMPLANT
PENCIL BUTTON HOLSTER BLD 10FT (ELECTRODE) ×2 IMPLANT
SET INTRODUCER 12FR PACEMAKER (SHEATH) IMPLANT
SET SHEATH INTRODUCER 10FR (MISCELLANEOUS) IMPLANT
SHEATH COOK PEEL AWAY SET 9F (SHEATH) IMPLANT
SUT MNCRL AB 4-0 PS2 18 (SUTURE) ×2 IMPLANT
SUT PROLENE 2 0 SH DA (SUTURE) ×2 IMPLANT
SUT SILK 2 0 (SUTURE)
SUT SILK 2-0 18XBRD TIE 12 (SUTURE) IMPLANT
SYR 20ML ECCENTRIC (SYRINGE) ×4 IMPLANT
SYR 5ML LUER SLIP (SYRINGE) ×2 IMPLANT
SYR BULB 3OZ (MISCELLANEOUS) ×2 IMPLANT
SYR CONTROL 10ML LL (SYRINGE) ×2 IMPLANT
TOWEL OR 17X24 6PK STRL BLUE (TOWEL DISPOSABLE) ×2 IMPLANT
TOWEL OR 17X26 10 PK STRL BLUE (TOWEL DISPOSABLE) ×2 IMPLANT
WATER STERILE IRR 1000ML POUR (IV SOLUTION) IMPLANT

## 2012-07-05 NOTE — Progress Notes (Signed)
Pt. Stated his upper right inner arm is painful at touch. It is slightly swollen, erythemia. Pt. Stated an IV was put in the right hand on June 27 for a procedure and the vein got red after the procedure. Stated since yesterday, it has gotten worse,more swollen, more redness ,hot to touch . Pt. Stated Dr. Johna Sheriff looked at it Friday and Dr. Gwenyth Bouillon saw it 3 weeks ago. Wife states they have put warm compresses on the site per Dr. Johna Sheriff.

## 2012-07-05 NOTE — H&P (View-Only) (Signed)
Chief complaint: Lung cancer and need for venous access  History: Patient is a 61 year old male with a recent diagnosis of small cell cancer of the lung. He has started chemotherapy but has had difficulty with venous access and is referred for placement of Port-A-Cath. He has had a central line on the left in the past but no other central IV access. Past Medical History  Diagnosis Date  . Crohn's disease of colon   . Pulmonary emboli   . COPD (chronic obstructive pulmonary disease)   . Emphysema   . Seizure disorder   . Kidney mass   . Seizures   . Arthritis   . Cancer   . Emphysema of lung   . Weight loss, unintentional   . Cough   . Chest pain   . Weakness    Past Surgical History  Procedure Date  . Arm surgery     left  . Right knee   . Rectal surgery     x7  . Abdominal surgery     x3  . Video bronchoscopy 06/09/2012    Procedure: VIDEO BRONCHOSCOPY WITHOUT FLUORO;  Surgeon: Leslye Peer, MD;  Location: [facility];  Service: Cardiopulmonary;  Laterality: Bilateral;   Current Outpatient Prescriptions  Medication Sig Dispense Refill  . budesonide-formoterol (SYMBICORT) 160-4.5 MCG/ACT inhaler Inhale 2 puffs into the lungs 2 (two) times daily.      . Calcium Carbonate-Vitamin D (CALCIUM 600 + D PO) Take 1 tablet by mouth daily.      . cyanocobalamin 1000 MCG tablet Take by mouth daily. once monthly.      . Hydrocodone-Acetaminophen 10-300 MG TABS 1-2 tabs daily prn      . LORazepam (ATIVAN) 1 MG tablet Take 1 tablet by mouth Three times a day.      . phenytoin (DILANTIN) 100 MG ER capsule Take 2 capsules by mouth Twice daily.      . phenytoin (DILANTIN) 30 MG ER capsule Take 30 mg by mouth 2 (two) times daily.      Marland Kitchen PROAIR HFA 108 (90 BASE) MCG/ACT inhaler Take 2 puffs by mouth Every 4 hours as needed.      . promethazine (PHENERGAN) 25 MG tablet Take 1 tablet by mouth as needed.      . tiotropium (SPIRIVA) 18 MCG inhalation capsule Place 18 mcg into inhaler  and inhale daily.       No Known Allergies  Exam: General: Thin alert white male in no distress HEENT: No palpable masses. Lymph nodes: No cervical or supraclavicular or axillary nodes palpable Lungs: Clear equal breath sounds bilaterally Extremities: No edema or deformity  Assessment and plan: recent diagnosis of lung cancer with need for central venous access. I discussed Port-A-Cath placement with the patient. We discussed indications and nature of the procedure as well as risks of bleeding, infection, pneumothorax, infection, and catheter displacement. We will try to get this scheduled prior to his next treatment.

## 2012-07-05 NOTE — Progress Notes (Signed)
REPORT GIVEN TO MARIA RN AS CAREGIVER 

## 2012-07-05 NOTE — Interval H&P Note (Signed)
History and Physical Interval Note:  07/05/2012 7:22 AM  Anthony [name]  has presented today for surgery, with the diagnosis of lung cancer and poor venous access  The various methods of treatment have been discussed with the patient and family. After consideration of risks, benefits and other options for treatment, the patient has consented to  Procedure(s) (LRB): INSERTION PORT-A-CATH (N/A) as a surgical intervention .  The patient's history has been reviewed, patient examined, no change in status, stable for surgery.  I have reviewed the patient's chart and labs.  Questions were answered to the patient's satisfaction.     Anthony [name] T

## 2012-07-05 NOTE — Transfer of Care (Signed)
Immediate Anesthesia Transfer of Care Note  Patient: Anthony [name]  Procedure(s) Performed: Procedure(s) (LRB): INSERTION PORT-A-CATH (N/A)  Patient Location: PACU  Anesthesia Type: MAC  Level of Consciousness: awake, alert , oriented and patient cooperative  Airway & Oxygen Therapy: Patient Spontanous Breathing and Patient connected to nasal cannula oxygen  Post-op Assessment: Report given to PACU RN and Post -op Vital signs reviewed and stable  Post vital signs: Reviewed and stable  Complications: No apparent anesthesia complications

## 2012-07-05 NOTE — Op Note (Signed)
Preoperative diagnosis: Cancer of the breast and the poor venous access  Postoperative diagnosis: Same  Procedure: Placement of Powerport subcutaneous venous port  Surgeon: Glenna Fellows M.D.  Anesthesia: Local with IV sedation  Description of procedure: Patient is brought to the operating room and placed in the supine position on the operating table. IV sedation was administered. The entire upper chest and neck were widely sterilely prepped and draped. Local anesthesia was used to infiltrate the insertion of port site. The right subclavian vein was cannulated with a needle and guidewire without difficulty and position in the superior vena cava was confirmed by fluoroscopy. The introducer was then placed over the guidewire and the flush catheter placed via the introducer which was stripped away and the tip of the catheter positioned near the cavoatrial junction. A small transverse incision was made in the anterior chest wall and subcutaneous pocket created. The catheter was tunneled into the pocket, trimmed to length, and attached to the flushed port which was positioned in the pocket. The port was sutured to the chest wall with interrupted 2-0 Prolene. The incisions were closed with subcutaneous interrupted Monocryl and the skin incisions closed with subcuticular Monocryl and Dermabond. The port was accessed and flushed and aspirated easily and was left flushed with concentrated heparin solution. Sponge needle as the counts were correct. The patient was taken to recovery in good condition.  Gwendelyn Lanting T  07/05/2012

## 2012-07-05 NOTE — Anesthesia Preprocedure Evaluation (Addendum)
Anesthesia Evaluation  Patient identified by MRN, date of birth, ID band Patient awake    Reviewed: Allergy & Precautions, H&P , NPO status , Patient's Chart, lab work & pertinent test results  History of Anesthesia Complications Negative for: history of anesthetic complications  Airway Mallampati: I TM Distance: >3 FB Neck ROM: Full    Dental  (+) Edentulous Upper and Dental Advisory Given   Pulmonary COPD COPD inhaler, Current Smoker, PE Emphysema Lung CA stage IV with hilar adenopathy; started chemo 2wks ago   + decreased breath sounds+ wheezing      Cardiovascular Rhythm:Regular     Neuro/Psych  Headaches, Seizures -,     GI/Hepatic negative GI ROS, Neg liver ROS,   Endo/Other  negative endocrine ROS  Renal/GU Renal InsufficiencyRenal disease (Cr 1.19)Kidney mass  negative genitourinary   Musculoskeletal negative musculoskeletal ROS (+)   Abdominal   Peds  Hematology negative hematology ROS (+)   Anesthesia Other Findings   Reproductive/Obstetrics                        Anesthesia Physical Anesthesia Plan  ASA: III  Anesthesia Plan: MAC   Post-op Pain Management:    Induction: Intravenous  Airway Management Planned: Natural Airway, Nasal Cannula and Simple Face Mask  Additional Equipment:   Intra-op Plan:   Post-operative Plan:   Informed Consent: I have reviewed the patients History and Physical, chart, labs and discussed the procedure including the risks, benefits and alternatives for the proposed anesthesia with the patient or authorized representative who has indicated his/her understanding and acceptance.   Dental advisory given  Plan Discussed with: CRNA, Anesthesiologist and Surgeon  Anesthesia Plan Comments:        Anesthesia Quick Evaluation

## 2012-07-05 NOTE — Progress Notes (Signed)
Dr. Krista Blue made aware of runs of PVC's, no new order given. Patient denies any pain or discomfort.

## 2012-07-05 NOTE — Anesthesia Postprocedure Evaluation (Signed)
Anesthesia Post Note  Patient: Anthony [name]  Procedure(s) Performed: Procedure(s) (LRB): INSERTION PORT-A-CATH (N/A)  Anesthesia type: MAC  Patient location: PACU  Post pain: Pain level controlled  Post assessment: Patient's Cardiovascular Status Stable  Last Vitals:  Filed Vitals:   07/05/12 0930  BP:   Pulse:   Temp: 36.1 C  Resp:     Post vital signs: Reviewed and stable  Level of consciousness: sedated  Complications: No apparent anesthesia complications

## 2012-07-05 NOTE — Preoperative (Signed)
Beta Blockers   Reason not to administer Beta Blockers:Not Applicable 

## 2012-07-05 NOTE — Progress Notes (Signed)
Pt. Stated he started chemo  2weeks ago.

## 2012-07-06 ENCOUNTER — Encounter (HOSPITAL_COMMUNITY): Payer: Self-pay | Admitting: General Surgery

## 2012-07-06 NOTE — Progress Notes (Signed)
Pt stated he has phlebitis in his arm, and that he showed it to his surgeon yesterday(dos).  He asked what to do about  This.  He was strongly encouraged/advised to call his primary care doctor and see about this right away ... His primary care  Is Dr Valentina Lucks.

## 2012-07-11 ENCOUNTER — Ambulatory Visit (HOSPITAL_BASED_OUTPATIENT_CLINIC_OR_DEPARTMENT_OTHER): Payer: Medicare Other

## 2012-07-11 ENCOUNTER — Other Ambulatory Visit (HOSPITAL_BASED_OUTPATIENT_CLINIC_OR_DEPARTMENT_OTHER): Payer: Medicare Other | Admitting: Lab

## 2012-07-11 ENCOUNTER — Other Ambulatory Visit: Payer: Self-pay | Admitting: *Deleted

## 2012-07-11 ENCOUNTER — Other Ambulatory Visit: Payer: Medicare Other | Admitting: Lab

## 2012-07-11 ENCOUNTER — Ambulatory Visit (HOSPITAL_BASED_OUTPATIENT_CLINIC_OR_DEPARTMENT_OTHER): Payer: Medicare Other | Admitting: Internal Medicine

## 2012-07-11 VITALS — BP 114/62 | HR 84 | Temp 97.2°F | Ht 71.5 in | Wt 141.7 lb

## 2012-07-11 DIAGNOSIS — Z5111 Encounter for antineoplastic chemotherapy: Secondary | ICD-10-CM

## 2012-07-11 DIAGNOSIS — C3410 Malignant neoplasm of upper lobe, unspecified bronchus or lung: Secondary | ICD-10-CM

## 2012-07-11 DIAGNOSIS — C3490 Malignant neoplasm of unspecified part of unspecified bronchus or lung: Secondary | ICD-10-CM

## 2012-07-11 DIAGNOSIS — R0602 Shortness of breath: Secondary | ICD-10-CM

## 2012-07-11 LAB — COMPREHENSIVE METABOLIC PANEL
AST: 18 U/L (ref 0–37)
BUN: 15 mg/dL (ref 6–23)
Calcium: 8.8 mg/dL (ref 8.4–10.5)
Chloride: 104 mEq/L (ref 96–112)
Creatinine, Ser: 1.07 mg/dL (ref 0.50–1.35)
Total Bilirubin: 0.2 mg/dL — ABNORMAL LOW (ref 0.3–1.2)

## 2012-07-11 LAB — CBC WITH DIFFERENTIAL/PLATELET
BASO%: 0.7 % (ref 0.0–2.0)
EOS%: 0.2 % (ref 0.0–7.0)
HCT: 34.8 % — ABNORMAL LOW (ref 38.4–49.9)
LYMPH%: 15.7 % (ref 14.0–49.0)
MCH: 30.4 pg (ref 27.2–33.4)
MCHC: 33.9 g/dL (ref 32.0–36.0)
MCV: 89.7 fL (ref 79.3–98.0)
MONO%: 8.1 % (ref 0.0–14.0)
NEUT%: 75.3 % — ABNORMAL HIGH (ref 39.0–75.0)
Platelets: 330 10*3/uL (ref 140–400)
RBC: 3.88 10*6/uL — ABNORMAL LOW (ref 4.20–5.82)
nRBC: 0 % (ref 0–0)

## 2012-07-11 MED ORDER — SODIUM CHLORIDE 0.9 % IV SOLN
100.0000 mg/m2 | Freq: Once | INTRAVENOUS | Status: AC
  Administered 2012-07-11: 180 mg via INTRAVENOUS
  Filled 2012-07-11: qty 9

## 2012-07-11 MED ORDER — SODIUM CHLORIDE 0.9 % IV SOLN
423.0000 mg | Freq: Once | INTRAVENOUS | Status: AC
  Administered 2012-07-11: 420 mg via INTRAVENOUS
  Filled 2012-07-11: qty 42

## 2012-07-11 MED ORDER — ONDANSETRON 16 MG/50ML IVPB (CHCC)
16.0000 mg | Freq: Once | INTRAVENOUS | Status: AC
  Administered 2012-07-11: 16 mg via INTRAVENOUS

## 2012-07-11 MED ORDER — HEPARIN SOD (PORK) LOCK FLUSH 100 UNIT/ML IV SOLN
500.0000 [IU] | Freq: Once | INTRAVENOUS | Status: AC | PRN
  Administered 2012-07-11: 500 [IU]
  Filled 2012-07-11: qty 5

## 2012-07-11 MED ORDER — SODIUM CHLORIDE 0.9 % IJ SOLN
10.0000 mL | INTRAMUSCULAR | Status: DC | PRN
  Administered 2012-07-11: 10 mL
  Filled 2012-07-11: qty 10

## 2012-07-11 MED ORDER — SODIUM CHLORIDE 0.9 % IV SOLN
Freq: Once | INTRAVENOUS | Status: AC
  Administered 2012-07-11: 13:00:00 via INTRAVENOUS

## 2012-07-11 MED ORDER — DEXAMETHASONE SODIUM PHOSPHATE 4 MG/ML IJ SOLN
20.0000 mg | Freq: Once | INTRAMUSCULAR | Status: AC
  Administered 2012-07-11: 20 mg via INTRAVENOUS

## 2012-07-11 MED ORDER — LIDOCAINE-PRILOCAINE 2.5-2.5 % EX CREA: TOPICAL_CREAM | CUTANEOUS | Status: DC | PRN

## 2012-07-11 NOTE — Patient Instructions (Signed)
[facility] Discharge Instructions for Patients Receiving Chemotherapy  Today you received the following chemotherapy agents Carboplatin and Etoposide.  To help prevent nausea and vomiting after your treatment, we encourage you to take your nausea medication.   If you develop nausea and vomiting that is not controlled by your nausea medication, call the clinic. If it is after clinic hours your family physician or the after hours number for the clinic or go to the Emergency Department.   BELOW ARE SYMPTOMS THAT SHOULD BE REPORTED IMMEDIATELY:  *FEVER GREATER THAN 100.5 F  *CHILLS WITH OR WITHOUT FEVER  NAUSEA AND VOMITING THAT IS NOT CONTROLLED WITH YOUR NAUSEA MEDICATION  *UNUSUAL SHORTNESS OF BREATH  *UNUSUAL BRUISING OR BLEEDING  TENDERNESS IN MOUTH AND THROAT WITH OR WITHOUT PRESENCE OF ULCERS  *URINARY PROBLEMS  *BOWEL PROBLEMS  UNUSUAL RASH Items with * indicate a potential emergency and should be followed up as soon as possible.  One of the nurses will contact you 24 hours after your treatment. Please let the nurse know about any problems that you may have experienced. Feel free to call the clinic you have any questions or concerns. The clinic phone number is [phone].   I have been informed and understand all the instructions given to me. I know to contact the clinic, my physician, or go to the Emergency Department if any problems should occur. I do not have any questions at this time, but understand that I may call the clinic during office hours   should I have any questions or need assistance in obtaining follow up care.    __________________________________________  _____________  __________ Signature of Patient or Authorized Representative            Date                   Time    __________________________________________ Nurse's Signature    

## 2012-07-11 NOTE — Progress Notes (Signed)
[name] [phone]   [phone]  OFFICE PROGRESS NOTE  Lillia Mountain, MD [address] [employer], [place]. [place] [address]  DIAGNOSIS: Extensive stage small cell lung cancer   PRIOR THERAPY: None   CURRENT THERAPY: Systemic chemotherapy with carboplatin for an AUC of 5 given on day 1 and etoposide at 100 mg per meter squared given on days one 2 and 3 with Neulasta support given on day 4 status post 1 cycle.  INTERVAL HISTORY: Anthony [name] 61 y.o. male returns to the clinic today for followup visit accompanied by several family members. The patient tolerated the first cycle of his systemic chemotherapy with carboplatin and etoposide fairly well. He has some fatigue and flulike symptoms after the Neulasta injection. He continues to have shortness breath with exertion. The patient denied having any significant nausea or vomiting. He has no chest pain, cough or hemoptysis. No significant weight loss. He has a Port-A-Cath placed recently.  MEDICAL HISTORY: Past Medical History  Diagnosis Date  . Crohn's disease of colon   . Pulmonary emboli   . COPD (chronic obstructive pulmonary disease)   . Emphysema   . Seizure disorder   . Kidney mass   . Seizures   . Arthritis   . Cancer   . Emphysema of lung   . Weight loss, unintentional   . Cough   . Chest pain   . Weakness     ALLERGIES:   has no known allergies.  MEDICATIONS:  Current Outpatient Prescriptions  Medication Sig Dispense Refill  . albuterol (PROVENTIL HFA;VENTOLIN HFA) 108 (90 BASE) MCG/ACT inhaler Inhale 2 puffs into the lungs every 6 (six) hours as needed. For shortness of breath.      . budesonide-formoterol (SYMBICORT) 160-4.5 MCG/ACT inhaler Inhale 2 puffs into the lungs 2 (two) times daily.      . Calcium Carbonate-Vitamin D (CALCIUM 600 + D PO) Take 1 tablet by mouth daily.      . cyanocobalamin (,VITAMIN B-12,) 1000 MCG/ML  injection Inject into the muscle every 30 (thirty) days. Administers on the 15th of each month.      . Hydrocodone-Acetaminophen (VICODIN HP) 10-300 MG TABS Take 1-2 tablets by mouth daily as needed. For pain.      Marland Kitchen LORazepam (ATIVAN) 1 MG tablet Take 1 mg by mouth 3 (three) times daily.      . phenytoin (DILANTIN) 100 MG ER capsule Take 200 mg by mouth 2 (two) times daily.      . phenytoin (DILANTIN) 30 MG ER capsule Take 60 mg by mouth 2 (two) times daily.      . prochlorperazine (COMPAZINE) 10 MG tablet Take 10 mg by mouth every 6 (six) hours as needed. For chemo-related nausea.      . promethazine (PHENERGAN) 25 MG tablet Take 25 mg by mouth daily as needed. For nausea.      Marland Kitchen tiotropium (SPIRIVA) 18 MCG inhalation capsule Place 18 mcg into inhaler and inhale daily.        SURGICAL HISTORY:  Past Surgical History  Procedure Date  . Arm surgery     left  . Right knee   . Rectal surgery     x7  . Abdominal surgery     x3  . Video bronchoscopy 06/09/2012    Procedure: VIDEO BRONCHOSCOPY WITHOUT FLUORO;  Surgeon: Leslye Peer, MD;  Location: [facility];  Service: Cardiopulmonary;  Laterality: Bilateral;  .  Portacath placement 07/05/2012    Procedure: INSERTION PORT-A-CATH;  Surgeon: Mariella Saa, MD;  Location: [facility] OR;  Service: General;  Laterality: N/A;    REVIEW OF SYSTEMS:  A comprehensive review of systems was negative except for: Constitutional: positive for fatigue Respiratory: positive for dyspnea on exertion   PHYSICAL EXAMINATION: General appearance: alert, cooperative and no distress Head: Normocephalic, without obvious abnormality, atraumatic Neck: no adenopathy Lymph nodes: Cervical, supraclavicular, and axillary nodes normal. Resp: clear to auscultation bilaterally Cardio: regular rate and rhythm, S1, S2 normal, no murmur, click, rub or gallop GI: soft, non-tender; bowel sounds normal; no masses,  no organomegaly Extremities: extremities normal, atraumatic,  no cyanosis or edema Neurologic: Alert and oriented X 3, normal strength and tone. Normal symmetric reflexes. Normal coordination and gait  ECOG PERFORMANCE STATUS: 1 - Symptomatic but completely ambulatory  Blood pressure 114/62, pulse 84, temperature 97.2 F (36.2 C), temperature source Oral, height 5' 11.5" (1.816 m), weight 141 lb 11.2 oz (64.275 kg).  LABORATORY DATA: Lab Results  Component Value Date   WBC 18.7* 07/11/2012   HGB 11.8* 07/11/2012   HCT 34.8* 07/11/2012   MCV 89.7 07/11/2012   PLT 330 07/11/2012      Chemistry      Component Value Date/Time   NA 143 07/04/2012 0843   K 3.8 07/04/2012 0843   CL 104 07/04/2012 0843   CO2 32 07/04/2012 0843   BUN 9 07/04/2012 0843   CREATININE 1.18 07/04/2012 0843      Component Value Date/Time   CALCIUM 8.8 07/04/2012 0843   ALKPHOS 157* 07/04/2012 0843   AST 15 07/04/2012 0843   ALT 13 07/04/2012 0843   BILITOT 0.2* 07/04/2012 0843       RADIOGRAPHIC STUDIES: Dg Chest 2 View  07/05/2012  *RADIOLOGY REPORT*  Clinical Data: Preop for Port-A-Cath insertion.  Lung cancer.  CHEST - 2 VIEW  Comparison: Chest CT 06/02/2012.  Findings: The cardiac silhouette, mediastinal and hilar contours are within normal limits.  There is a right hilar mass and a more peripherally located right pulmonary nodule.  No acute pulmonary findings.  Underlying emphysematous changes are noted.  IMPRESSION:  1.  Right lung lesion and hilar adenopathy. 2.  Emphysematous changes.  Original Report Authenticated By: P. Loralie Champagne, M.D.   Ct Head W Wo Contrast  06/15/2012  *RADIOLOGY REPORT*  Clinical Data: New diagnosis lung cancer.  Headaches.  CT HEAD WITHOUT AND WITH CONTRAST  Technique:  Contiguous axial images were obtained from the base of the skull through the vertex without and with intravenous contrast.  Contrast: OMNIPAQUE IOHEXOL 300 MG/ML  SOLN  Comparison: None.  Findings: The brain has a normal appearance without evidence of atrophy, old or acute  infarction, mass lesion, hemorrhage, hydrocephalus or extra-axial collection.  No abnormal enhancement occurs.  Visualized sinuses, middle ears and mastoids are clear. No calvarial abnormality.  IMPRESSION: Normal examination.  No evidence of metastatic disease.  Original Report Authenticated By: Thomasenia Sales, M.D.   Dg Chest Port 1 View  07/05/2012  *RADIOLOGY REPORT*  Clinical Data: Port-A-Cath placement.  PORTABLE CHEST - 1 VIEW  Comparison: Plain film chest 07/05/2012 and CT chest 06/02/2012.  Findings: Right subclavian Port-A-Cath is in place with the tip in the lower superior vena cava.  No pneumothorax.  Lungs are emphysematous.  Right perihilar mass again seen.  Heart size normal.  IMPRESSION:  1.  Port-A-Cath in good position.  No pneumothorax. 2.  Right perihilar mass. 3.  Emphysema.  Original Report Authenticated By: Bernadene Bell. D'ALESSIO, M.D.   Dg Fluoro Guide Cv Line-no Report  07/05/2012  CLINICAL DATA: port-a-cath placement in OR   FLOURO GUIDE CV LINE  Fluoroscopy was utilized by the requesting physician.  No radiographic  interpretation.      ASSESSMENT: This is a very pleasant 61 years old white male with extensive stage small cell lung cancer status post 1 cycle of systemic chemotherapy with carboplatin and etoposide. The patient tolerated the first cycle of his chemotherapy fairly well with no significant adverse effects except for the fatigue after the Neulasta injection.  PLAN: We will proceed with cycle #2 today as scheduled. The patient would come back for followup visit in 3 weeks with repeat CT scan of the chest, abdomen and pelvis for restaging of his disease. He was advised to call me immediately if he has any concerning symptoms in the interval.  All questions were answered. The patient knows to call the clinic with any problems, questions or concerns. We can certainly see the patient much sooner if necessary.  I spent 15 minutes counseling the patient face to face. The  total time spent in the appointment was 25 minutes.

## 2012-07-11 NOTE — Telephone Encounter (Signed)
gv pt appt schedule for July/August including ct for 8/16.

## 2012-07-12 ENCOUNTER — Ambulatory Visit (HOSPITAL_BASED_OUTPATIENT_CLINIC_OR_DEPARTMENT_OTHER): Payer: Medicare Other

## 2012-07-12 VITALS — BP 134/92 | HR 98 | Temp 97.1°F

## 2012-07-12 DIAGNOSIS — Z5111 Encounter for antineoplastic chemotherapy: Secondary | ICD-10-CM

## 2012-07-12 DIAGNOSIS — C3490 Malignant neoplasm of unspecified part of unspecified bronchus or lung: Secondary | ICD-10-CM

## 2012-07-12 DIAGNOSIS — C3410 Malignant neoplasm of upper lobe, unspecified bronchus or lung: Secondary | ICD-10-CM

## 2012-07-12 MED ORDER — HEPARIN SOD (PORK) LOCK FLUSH 100 UNIT/ML IV SOLN
500.0000 [IU] | Freq: Once | INTRAVENOUS | Status: AC | PRN
  Administered 2012-07-12: 500 [IU]
  Filled 2012-07-12: qty 5

## 2012-07-12 MED ORDER — SODIUM CHLORIDE 0.9 % IV SOLN
Freq: Once | INTRAVENOUS | Status: AC
  Administered 2012-07-12: 15:00:00 via INTRAVENOUS

## 2012-07-12 MED ORDER — SODIUM CHLORIDE 0.9 % IJ SOLN
10.0000 mL | INTRAMUSCULAR | Status: DC | PRN
  Administered 2012-07-12: 10 mL
  Filled 2012-07-12: qty 10

## 2012-07-12 MED ORDER — SODIUM CHLORIDE 0.9 % IV SOLN
100.0000 mg/m2 | Freq: Once | INTRAVENOUS | Status: AC
  Administered 2012-07-12: 180 mg via INTRAVENOUS
  Filled 2012-07-12: qty 9

## 2012-07-12 MED ORDER — ONDANSETRON 8 MG/50ML IVPB (CHCC)
8.0000 mg | Freq: Once | INTRAVENOUS | Status: AC
  Administered 2012-07-12: 8 mg via INTRAVENOUS

## 2012-07-12 NOTE — Patient Instructions (Addendum)
[facility] Discharge Instructions for Patients Receiving Chemotherapy  Today you received the following chemotherapy agents : VP16  To help prevent nausea and vomiting after your treatment, we encourage you to take your nausea medication as instructed by your physician, and as needed for nausea.    If you develop nausea and vomiting that is not controlled by your nausea medication, call the clinic. If it is after clinic hours your family physician or the after hours number for the clinic or go to the Emergency Department.   BELOW ARE SYMPTOMS THAT SHOULD BE REPORTED IMMEDIATELY:  *FEVER GREATER THAN 100.5 F  *CHILLS WITH OR WITHOUT FEVER  NAUSEA AND VOMITING THAT IS NOT CONTROLLED WITH YOUR NAUSEA MEDICATION  *UNUSUAL SHORTNESS OF BREATH  *UNUSUAL BRUISING OR BLEEDING  TENDERNESS IN MOUTH AND THROAT WITH OR WITHOUT PRESENCE OF ULCERS  *URINARY PROBLEMS  *BOWEL PROBLEMS  UNUSUAL RASH Items with * indicate a potential emergency and should be followed up as soon as possible.  One of the nurses will contact you 24 hours after your treatment. Please let the nurse know about any problems that you may have experienced. Feel free to call the clinic you have any questions or concerns. The clinic phone number is [phone].   I have been informed and understand all the instructions given to me. I know to contact the clinic, my physician, or go to the Emergency Department if any problems should occur. I do not have any questions at this time, but understand that I may call the clinic during office hours   should I have any questions or need assistance in obtaining follow up care.    __________________________________________  _____________  __________ Signature of Patient or Authorized Representative            Date                   Time    __________________________________________ Nurse's Signature

## 2012-07-13 ENCOUNTER — Ambulatory Visit: Payer: Medicare Other | Admitting: Nutrition

## 2012-07-13 ENCOUNTER — Ambulatory Visit (HOSPITAL_BASED_OUTPATIENT_CLINIC_OR_DEPARTMENT_OTHER): Payer: Medicare Other

## 2012-07-13 VITALS — BP 113/80 | HR 84 | Temp 97.5°F

## 2012-07-13 DIAGNOSIS — C3410 Malignant neoplasm of upper lobe, unspecified bronchus or lung: Secondary | ICD-10-CM

## 2012-07-13 DIAGNOSIS — C3490 Malignant neoplasm of unspecified part of unspecified bronchus or lung: Secondary | ICD-10-CM

## 2012-07-13 DIAGNOSIS — Z5111 Encounter for antineoplastic chemotherapy: Secondary | ICD-10-CM

## 2012-07-13 MED ORDER — SODIUM CHLORIDE 0.9 % IV SOLN
100.0000 mg/m2 | Freq: Once | INTRAVENOUS | Status: AC
  Administered 2012-07-13: 180 mg via INTRAVENOUS
  Filled 2012-07-13: qty 9

## 2012-07-13 MED ORDER — SODIUM CHLORIDE 0.9 % IJ SOLN
10.0000 mL | INTRAMUSCULAR | Status: DC | PRN
  Administered 2012-07-13: 10 mL
  Filled 2012-07-13: qty 10

## 2012-07-13 MED ORDER — SODIUM CHLORIDE 0.9 % IV SOLN
Freq: Once | INTRAVENOUS | Status: AC
  Administered 2012-07-13: 15:00:00 via INTRAVENOUS

## 2012-07-13 MED ORDER — PROCHLORPERAZINE EDISYLATE 5 MG/ML IJ SOLN
10.0000 mg | Freq: Once | INTRAMUSCULAR | Status: AC
  Administered 2012-07-13: 10 mg via INTRAVENOUS

## 2012-07-13 MED ORDER — HEPARIN SOD (PORK) LOCK FLUSH 100 UNIT/ML IV SOLN
500.0000 [IU] | Freq: Once | INTRAVENOUS | Status: AC | PRN
  Administered 2012-07-13: 500 [IU]
  Filled 2012-07-13: qty 5

## 2012-07-13 NOTE — Progress Notes (Signed)
Out-patient Oncology Nutrition Follow up Note  Spoke with Mr. Oki and his wife in the chemotherapy treatment room. His weight is up 4.5 lb today to 141 lb.Marland Kitchen He reported his appetite and intake are getting much better. He stated he is drinking 4 to 5 Ensure Plus nutrition supplements on most days. His wife reported on some days he can only drink 2 Ensure Plus nutrition supplements. He reported has a little nausea occasionally.   NUTRITION DIAGNOSIS: Unintended weight loss, has improved, continue.   INTERVENTION: I have encouraged the patient to continue to have increased PO intake plus consumption of 4 to 5 Ensure Plus supplements daily. I have educated the patient on nutrition symptom management for nausea and bland foods. The patient and his wife were without any nutrition related questions.   MONITOR/ GOALS: 1. The patient will continue to tolerate increased PO intake to minimize weight loss.   NEXT VISIT: Will send patient information to scheduler to make follow up nutrition appointment.    RD available for nutrition needs.   Iven Finn, MS, RD, LDN [phone]

## 2012-07-13 NOTE — Patient Instructions (Signed)
[facility] Discharge Instructions for Patients Receiving Chemotherapy  Today you received the following chemotherapy agents etoposide  To help prevent nausea and vomiting after your treatment, we encourage you to take your nausea medication compazine or phenergan Begin taking it at 9pm and take it as often as prescribed for the next 48 hours as needed.   If you develop nausea and vomiting that is not controlled by your nausea medication, call the clinic. If it is after clinic hours your family physician or the after hours number for the clinic or go to the Emergency Department.   BELOW ARE SYMPTOMS THAT SHOULD BE REPORTED IMMEDIATELY:  *FEVER GREATER THAN 100.5 F  *CHILLS WITH OR WITHOUT FEVER  NAUSEA AND VOMITING THAT IS NOT CONTROLLED WITH YOUR NAUSEA MEDICATION  *UNUSUAL SHORTNESS OF BREATH  *UNUSUAL BRUISING OR BLEEDING  TENDERNESS IN MOUTH AND THROAT WITH OR WITHOUT PRESENCE OF ULCERS  *URINARY PROBLEMS  *BOWEL PROBLEMS  UNUSUAL RASH Items with * indicate a potential emergency and should be followed up as soon as possible.  One of the nurses will contact you 24 hours after your treatment. Please let the nurse know about any problems that you may have experienced. Feel free to call the clinic you have any questions or concerns. The clinic phone number is [phone].   I have been informed and understand all the instructions given to me. I know to contact the clinic, my physician, or go to the Emergency Department if any problems should occur. I do not have any questions at this time, but understand that I may call the clinic during office hours   should I have any questions or need assistance in obtaining follow up care.    __________________________________________  _____________  __________ Signature of Patient or Authorized Representative            Date                   Time    __________________________________________ Nurse's  Signature

## 2012-07-14 ENCOUNTER — Ambulatory Visit (HOSPITAL_BASED_OUTPATIENT_CLINIC_OR_DEPARTMENT_OTHER): Payer: Medicare Other

## 2012-07-14 VITALS — BP 87/64 | HR 94 | Temp 97.5°F

## 2012-07-14 DIAGNOSIS — C3410 Malignant neoplasm of upper lobe, unspecified bronchus or lung: Secondary | ICD-10-CM

## 2012-07-14 DIAGNOSIS — C3490 Malignant neoplasm of unspecified part of unspecified bronchus or lung: Secondary | ICD-10-CM

## 2012-07-14 MED ORDER — PEGFILGRASTIM INJECTION 6 MG/0.6ML
6.0000 mg | Freq: Once | SUBCUTANEOUS | Status: AC
  Administered 2012-07-14: 6 mg via SUBCUTANEOUS
  Filled 2012-07-14: qty 0.6

## 2012-07-14 NOTE — Progress Notes (Signed)
Pt here for injection today, BP 87/64.  Pt not on BP medication, denies any dizziness or chest pain/palpitations.  Pt states he has been eating and drinking well w/o difficulty and feels fine.  Spoke with Dr. Arbutus Ped, who states for pt to increase fluid intake, re-check BP at local pharmacy, and call office back if it does not improve.  Spoke with pt and male with him, informed them of this, and to call office back once pt has increased fluid intake and re-checked BP.  They both verbalize understanding.

## 2012-07-18 ENCOUNTER — Other Ambulatory Visit (HOSPITAL_BASED_OUTPATIENT_CLINIC_OR_DEPARTMENT_OTHER): Payer: Medicare Other

## 2012-07-18 ENCOUNTER — Telehealth: Payer: Self-pay | Admitting: Medical Oncology

## 2012-07-18 DIAGNOSIS — C3490 Malignant neoplasm of unspecified part of unspecified bronchus or lung: Secondary | ICD-10-CM

## 2012-07-18 LAB — CBC WITH DIFFERENTIAL/PLATELET
Basophils Absolute: 0.1 10*3/uL (ref 0.0–0.1)
Eosinophils Absolute: 0 10*3/uL (ref 0.0–0.5)
HGB: 10.9 g/dL — ABNORMAL LOW (ref 13.0–17.1)
NEUT#: 10.6 10*3/uL — ABNORMAL HIGH (ref 1.5–6.5)
RDW: 13.7 % (ref 11.0–14.6)
lymph#: 1.5 10*3/uL (ref 0.9–3.3)

## 2012-07-18 LAB — COMPREHENSIVE METABOLIC PANEL
Albumin: 4.2 g/dL (ref 3.5–5.2)
BUN: 37 mg/dL — ABNORMAL HIGH (ref 6–23)
Calcium: 9.2 mg/dL (ref 8.4–10.5)
Chloride: 102 mEq/L (ref 96–112)
Glucose, Bld: 92 mg/dL (ref 70–99)
Potassium: 4.5 mEq/L (ref 3.5–5.3)

## 2012-07-18 NOTE — Telephone Encounter (Signed)
Pt had low BP last Thursday after Neulasta- this am his BP was 103/69. He had a lot of bone pain after Neulasta but says he feels better today. He has been drinking extra fluids . I reminded him to call for temp > 100.5 or any concerns

## 2012-07-25 ENCOUNTER — Other Ambulatory Visit (HOSPITAL_BASED_OUTPATIENT_CLINIC_OR_DEPARTMENT_OTHER): Payer: Medicare Other

## 2012-07-25 DIAGNOSIS — C3490 Malignant neoplasm of unspecified part of unspecified bronchus or lung: Secondary | ICD-10-CM

## 2012-07-25 LAB — CBC WITH DIFFERENTIAL/PLATELET
Basophils Absolute: 0.1 10*3/uL (ref 0.0–0.1)
EOS%: 0.1 % (ref 0.0–7.0)
Eosinophils Absolute: 0 10*3/uL (ref 0.0–0.5)
HGB: 9.4 g/dL — ABNORMAL LOW (ref 13.0–17.1)
MCH: 30.4 pg (ref 27.2–33.4)
NEUT#: 20.1 10*3/uL — ABNORMAL HIGH (ref 1.5–6.5)
RDW: 13.2 % (ref 11.0–14.6)
lymph#: 2.9 10*3/uL (ref 0.9–3.3)

## 2012-07-25 LAB — COMPREHENSIVE METABOLIC PANEL
AST: 14 U/L (ref 0–37)
Albumin: 3.6 g/dL (ref 3.5–5.2)
BUN: 8 mg/dL (ref 6–23)
Calcium: 7.9 mg/dL — ABNORMAL LOW (ref 8.4–10.5)
Chloride: 108 mEq/L (ref 96–112)
Potassium: 3.1 mEq/L — ABNORMAL LOW (ref 3.5–5.3)
Sodium: 142 mEq/L (ref 135–145)
Total Protein: 6 g/dL (ref 6.0–8.3)

## 2012-07-26 ENCOUNTER — Other Ambulatory Visit: Payer: Self-pay | Admitting: *Deleted

## 2012-07-26 DIAGNOSIS — E876 Hypokalemia: Secondary | ICD-10-CM

## 2012-07-26 MED ORDER — POTASSIUM CHLORIDE CRYS ER 20 MEQ PO TBCR: 20.0000 meq | EXTENDED_RELEASE_TABLET | Freq: Every day | ORAL | Status: DC

## 2012-07-26 NOTE — Progress Notes (Signed)
Quick Note:  Call patient with the result and order K Dur 20 meq po qd X 7 ______ 

## 2012-07-26 NOTE — Telephone Encounter (Signed)
Message copied by Caren Griffins on Tue Jul 26, 2012  3:12 PM ------      Message from: Si Gaul      Created: Tue Jul 26, 2012  8:19 AM       Call patient with the result and order K Dur 20 meq po qd X 7

## 2012-07-26 NOTE — Telephone Encounter (Signed)
K 3.1, pt's wife verbalized understanding regarding x 7 days.  SLJ

## 2012-07-29 ENCOUNTER — Encounter (HOSPITAL_COMMUNITY): Payer: Self-pay

## 2012-07-29 ENCOUNTER — Ambulatory Visit (HOSPITAL_COMMUNITY)
Admission: RE | Admit: 2012-07-29 | Discharge: 2012-07-29 | Disposition: A | Discharge status: Home / Self Care | Payer: Medicare Other | Source: Ambulatory Visit | Attending: Internal Medicine | Admitting: Internal Medicine

## 2012-07-29 DIAGNOSIS — C3490 Malignant neoplasm of unspecified part of unspecified bronchus or lung: Secondary | ICD-10-CM

## 2012-07-29 DIAGNOSIS — R222 Localized swelling, mass and lump, trunk: Secondary | ICD-10-CM | POA: Insufficient documentation

## 2012-07-29 DIAGNOSIS — Z933 Colostomy status: Secondary | ICD-10-CM | POA: Insufficient documentation

## 2012-07-29 DIAGNOSIS — N281 Cyst of kidney, acquired: Secondary | ICD-10-CM | POA: Insufficient documentation

## 2012-07-29 DIAGNOSIS — Z79899 Other long term (current) drug therapy: Secondary | ICD-10-CM | POA: Insufficient documentation

## 2012-07-29 DIAGNOSIS — N289 Disorder of kidney and ureter, unspecified: Secondary | ICD-10-CM | POA: Insufficient documentation

## 2012-07-29 MED ORDER — IOHEXOL 300 MG/ML  SOLN: 100.0000 mL | Freq: Once | INTRAMUSCULAR | Status: AC | PRN

## 2012-08-01 ENCOUNTER — Ambulatory Visit: Payer: Medicare Other | Admitting: Nutrition

## 2012-08-01 ENCOUNTER — Other Ambulatory Visit: Payer: Medicare Other | Admitting: Lab

## 2012-08-01 ENCOUNTER — Ambulatory Visit (HOSPITAL_BASED_OUTPATIENT_CLINIC_OR_DEPARTMENT_OTHER): Payer: Medicare Other

## 2012-08-01 ENCOUNTER — Telehealth: Payer: Self-pay | Admitting: Internal Medicine

## 2012-08-01 ENCOUNTER — Ambulatory Visit (HOSPITAL_BASED_OUTPATIENT_CLINIC_OR_DEPARTMENT_OTHER): Payer: Medicare Other | Admitting: Internal Medicine

## 2012-08-01 ENCOUNTER — Other Ambulatory Visit (HOSPITAL_BASED_OUTPATIENT_CLINIC_OR_DEPARTMENT_OTHER): Payer: Medicare Other | Admitting: Lab

## 2012-08-01 VITALS — BP 127/70 | HR 87 | Temp 97.0°F | Resp 20 | Ht 70.5 in | Wt 144.0 lb

## 2012-08-01 DIAGNOSIS — C3490 Malignant neoplasm of unspecified part of unspecified bronchus or lung: Secondary | ICD-10-CM

## 2012-08-01 DIAGNOSIS — Z5111 Encounter for antineoplastic chemotherapy: Secondary | ICD-10-CM

## 2012-08-01 DIAGNOSIS — C3410 Malignant neoplasm of upper lobe, unspecified bronchus or lung: Secondary | ICD-10-CM

## 2012-08-01 LAB — COMPREHENSIVE METABOLIC PANEL
ALT: 10 U/L (ref 0–53)
Alkaline Phosphatase: 131 U/L — ABNORMAL HIGH (ref 39–117)
Creatinine, Ser: 1.22 mg/dL (ref 0.50–1.35)
Sodium: 142 mEq/L (ref 135–145)
Total Bilirubin: 0.2 mg/dL — ABNORMAL LOW (ref 0.3–1.2)
Total Protein: 6.4 g/dL (ref 6.0–8.3)

## 2012-08-01 LAB — CBC WITH DIFFERENTIAL/PLATELET
BASO%: 0.3 % (ref 0.0–2.0)
EOS%: 0.3 % (ref 0.0–7.0)
Eosinophils Absolute: 0.1 10*3/uL (ref 0.0–0.5)
LYMPH%: 13.6 % — ABNORMAL LOW (ref 14.0–49.0)
MCH: 30.4 pg (ref 27.2–33.4)
MCHC: 33.6 g/dL (ref 32.0–36.0)
MCV: 90.6 fL (ref 79.3–98.0)
MONO%: 6.6 % (ref 0.0–14.0)
NEUT#: 14.6 10*3/uL — ABNORMAL HIGH (ref 1.5–6.5)
Platelets: 184 10*3/uL (ref 140–400)
RBC: 2.99 10*6/uL — ABNORMAL LOW (ref 4.20–5.82)
RDW: 15.4 % — ABNORMAL HIGH (ref 11.0–14.6)
nRBC: 0 % (ref 0–0)

## 2012-08-01 MED ORDER — SODIUM CHLORIDE 0.9 % IV SOLN
415.5000 mg | Freq: Once | INTRAVENOUS | Status: AC
  Administered 2012-08-01: 420 mg via INTRAVENOUS
  Filled 2012-08-01: qty 42

## 2012-08-01 MED ORDER — SODIUM CHLORIDE 0.9 % IV SOLN
Freq: Once | INTRAVENOUS | Status: AC
  Administered 2012-08-01: 14:00:00 via INTRAVENOUS

## 2012-08-01 MED ORDER — SODIUM CHLORIDE 0.9 % IV SOLN
100.0000 mg/m2 | Freq: Once | INTRAVENOUS | Status: AC
  Administered 2012-08-01: 180 mg via INTRAVENOUS
  Filled 2012-08-01: qty 9

## 2012-08-01 MED ORDER — SODIUM CHLORIDE 0.9 % IJ SOLN
10.0000 mL | INTRAMUSCULAR | Status: DC | PRN
  Administered 2012-08-01: 10 mL
  Filled 2012-08-01: qty 10

## 2012-08-01 MED ORDER — DEXAMETHASONE SODIUM PHOSPHATE 4 MG/ML IJ SOLN
20.0000 mg | Freq: Once | INTRAMUSCULAR | Status: AC
  Administered 2012-08-01: 20 mg via INTRAVENOUS

## 2012-08-01 MED ORDER — HEPARIN SOD (PORK) LOCK FLUSH 100 UNIT/ML IV SOLN
500.0000 [IU] | Freq: Once | INTRAVENOUS | Status: AC | PRN
  Administered 2012-08-01: 500 [IU]
  Filled 2012-08-01: qty 5

## 2012-08-01 MED ORDER — ONDANSETRON 16 MG/50ML IVPB (CHCC)
16.0000 mg | Freq: Once | INTRAVENOUS | Status: AC
  Administered 2012-08-01: 16 mg via INTRAVENOUS

## 2012-08-01 NOTE — Telephone Encounter (Signed)
gve the pt his aug,sept 2013 appt calendar °

## 2012-08-01 NOTE — Progress Notes (Signed)
Anthony [name] reports that his appetite has returned.  He does report nausea after chemotherapy.  He tries to stay on top of that by taking his nausea medication.  His weight has increased to 144 pounds documented August 19 from 141 pounds documented July 31.  He reports he was told to drink more Gatorade secondary to low potassium levels and he does tolerate this much better than water and has decreased leg cramps. The patient has no other concerns today.  NUTRITION DIAGNOSIS:  Unintended weight loss continues to improve.  INTERVENTION:  I have encouraged the patient to continue oral diet as tolerated to provide adequate calories for weight maintenance.  I have given him a fact sheet and educated him on foods that have a lot of potassium in them to incorporate on a regular basis.  I have answered his questions.  MONITORING/EVALUATION/GOALS:  The patient will continue to tolerate oral intake to promote weight maintenance.  NEXT VISIT:  Tuesday, September 10 during chemotherapy.   ______________________________ Zenovia Jarred, RD, CSO, LDN Clinical Nutrition Specialist BN/MEDQ  D:  08/01/2012  T:  08/01/2012  Job:  [id number]

## 2012-08-01 NOTE — Patient Instructions (Addendum)
[facility] Discharge Instructions for Patients Receiving Chemotherapy  Today you received the following chemotherapy agents Carboplatin and Etoposide  To help prevent nausea and vomiting after your treatment, we encourage you to take your nausea medication Begin taking it at 7 pm and take it as often as prescribed for the next 24 to 72 hours.   If you develop nausea and vomiting that is not controlled by your nausea medication, call the clinic. If it is after clinic hours your family physician or the after hours number for the clinic or go to the Emergency Department.   BELOW ARE SYMPTOMS THAT SHOULD BE REPORTED IMMEDIATELY:  *FEVER GREATER THAN 100.5 F  *CHILLS WITH OR WITHOUT FEVER  NAUSEA AND VOMITING THAT IS NOT CONTROLLED WITH YOUR NAUSEA MEDICATION  *UNUSUAL SHORTNESS OF BREATH  *UNUSUAL BRUISING OR BLEEDING  TENDERNESS IN MOUTH AND THROAT WITH OR WITHOUT PRESENCE OF ULCERS  *URINARY PROBLEMS  *BOWEL PROBLEMS  UNUSUAL RASH Items with * indicate a potential emergency and should be followed up as soon as possible.  One of the nurses will contact you 24 hours after your treatment. Please let the nurse know about any problems that you may have experienced. Feel free to call the clinic you have any questions or concerns. The clinic phone number is [phone].   I have been informed and understand all the instructions given to me. I know to contact the clinic, my physician, or go to the Emergency Department if any problems should occur. I do not have any questions at this time, but understand that I may call the clinic during office hours   should I have any questions or need assistance in obtaining follow up care.    __________________________________________  _____________  __________ Signature of Patient or Authorized Representative            Date                   Time    __________________________________________ Nurse's Signature    

## 2012-08-01 NOTE — Progress Notes (Signed)
[facility] Telephone:([phone]   [phone]  OFFICE PROGRESS NOTE  Lillia Mountain, MD [address] [employer], [place]. [place] [address]  DIAGNOSIS: Extensive stage small cell lung cancer diagnosed in June of 2013  PRIOR THERAPY: None   CURRENT THERAPY: Systemic chemotherapy with carboplatin for an AUC of 5 given on day 1 and etoposide at 100 mg per meter squared given on days one 2 and 3 with Neulasta support given on day 4 status post 2 cycles.   INTERVAL HISTORY: Anthony [name] 61 y.o. male returns to the clinic today for followup visit accompanied by his niece. The patient is doing fine today with no specific complaints. He tolerated his previous 2 cycles of systemic chemotherapy fairly well with no significant adverse effects except for mild fatigue and shortness breath with exertion. He denied having any significant nausea or vomiting, no fever or chills. He has no significant weight loss or night sweats. The patient has repeat CT scan of the chest, abdomen and pelvis performed recently and he is here today for evaluation and discussion of his scan results.  MEDICAL HISTORY: Past Medical History  Diagnosis Date  . Crohn's disease of colon   . Pulmonary emboli   . COPD (chronic obstructive pulmonary disease)   . Emphysema   . Seizure disorder   . Kidney mass   . Seizures   . Arthritis   . Emphysema of lung   . Weight loss, unintentional   . Cough   . Chest pain   . Weakness   . Cancer     lung     ALLERGIES:   has no known allergies.  MEDICATIONS:  Current Outpatient Prescriptions  Medication Sig Dispense Refill  . albuterol (PROVENTIL HFA;VENTOLIN HFA) 108 (90 BASE) MCG/ACT inhaler Inhale 2 puffs into the lungs every 6 (six) hours as needed. For shortness of breath.      . budesonide-formoterol (SYMBICORT) 160-4.5 MCG/ACT inhaler Inhale 2 puffs into the lungs 2 (two) times daily.      .  Calcium Carbonate-Vitamin D (CALCIUM 600 + D PO) Take 1 tablet by mouth daily.      . cyanocobalamin (,VITAMIN B-12,) 1000 MCG/ML injection Inject into the muscle every 30 (thirty) days. Administers on the 15th of each month.      . Hydrocodone-Acetaminophen (VICODIN HP) 10-300 MG TABS Take 1-2 tablets by mouth daily as needed. For pain.      Marland Kitchen lidocaine-prilocaine (EMLA) cream Apply topically as needed. Apply to port 1hr before chemo  30 g  0  . LORazepam (ATIVAN) 1 MG tablet Take 1 mg by mouth 3 (three) times daily.      . phenytoin (DILANTIN) 100 MG ER capsule Take 200 mg by mouth 2 (two) times daily.      . phenytoin (DILANTIN) 30 MG ER capsule Take 60 mg by mouth 2 (two) times daily.      . potassium chloride SA (K-DUR,KLOR-CON) 20 MEQ tablet Take 1 tablet (20 mEq total) by mouth daily.  7 tablet  0  . prochlorperazine (COMPAZINE) 10 MG tablet Take 10 mg by mouth every 6 (six) hours as needed. For chemo-related nausea.      . promethazine (PHENERGAN) 25 MG tablet Take 25 mg by mouth daily as needed. For nausea.      Marland Kitchen tiotropium (SPIRIVA) 18 MCG inhalation capsule Place 18 mcg into inhaler and inhale daily.  SURGICAL HISTORY:  Past Surgical History  Procedure Date  . Arm surgery     left  . Right knee   . Rectal surgery     x7  . Abdominal surgery     x3  . Video bronchoscopy 06/09/2012    Procedure: VIDEO BRONCHOSCOPY WITHOUT FLUORO;  Surgeon: Leslye Peer, MD;  Location: [facility];  Service: Cardiopulmonary;  Laterality: Bilateral;  . Portacath placement 07/05/2012    Procedure: INSERTION PORT-A-CATH;  Surgeon: Mariella Saa, MD;  Location: [facility] OR;  Service: General;  Laterality: N/A;    REVIEW OF SYSTEMS:  A comprehensive review of systems was negative except for: Constitutional: positive for fatigue Respiratory: positive for dyspnea on exertion   PHYSICAL EXAMINATION: General appearance: alert, cooperative and no distress Head: Normocephalic, without obvious  abnormality, atraumatic Neck: no adenopathy Lymph nodes: Cervical, supraclavicular, and axillary nodes normal. Resp: clear to auscultation bilaterally Cardio: regular rate and rhythm, S1, S2 normal, no murmur, click, rub or gallop GI: soft, non-tender; bowel sounds normal; no masses,  no organomegaly Extremities: extremities normal, atraumatic, no cyanosis or edema Neurologic: Alert and oriented X 3, normal strength and tone. Normal symmetric reflexes. Normal coordination and gait  ECOG PERFORMANCE STATUS: 1 - Symptomatic but completely ambulatory  Blood pressure 127/70, pulse 87, temperature 97 F (36.1 C), temperature source Oral, resp. rate 20, height 5' 10.5" (1.791 m), weight 144 lb (65.318 kg).  LABORATORY DATA: Lab Results  Component Value Date   WBC 18.4* 08/01/2012   HGB 9.1* 08/01/2012   HCT 27.1* 08/01/2012   MCV 90.6 08/01/2012   PLT 184 08/01/2012      Chemistry      Component Value Date/Time   NA 142 07/25/2012 1423   K 3.1* 07/25/2012 1423   CL 108 07/25/2012 1423   CO2 27 07/25/2012 1423   BUN 8 07/25/2012 1423   CREATININE 1.21 07/25/2012 1423      Component Value Date/Time   CALCIUM 7.9* 07/25/2012 1423   ALKPHOS 146* 07/25/2012 1423   AST 14 07/25/2012 1423   ALT 12 07/25/2012 1423   BILITOT 0.2* 07/25/2012 1423       RADIOGRAPHIC STUDIES: Dg Chest 2 View  07/05/2012  *RADIOLOGY REPORT*  Clinical Data: Preop for Port-A-Cath insertion.  Lung cancer.  CHEST - 2 VIEW  Comparison: Chest CT 06/02/2012.  Findings: The cardiac silhouette, mediastinal and hilar contours are within normal limits.  There is a right hilar mass and a more peripherally located right pulmonary nodule.  No acute pulmonary findings.  Underlying emphysematous changes are noted.  IMPRESSION:  1.  Right lung lesion and hilar adenopathy. 2.  Emphysematous changes.  Original Report Authenticated By: P. Loralie Champagne, M.D.   Ct Chest W Contrast  07/29/2012  *RADIOLOGY REPORT*  Clinical Data:  Lung  cancer diagnosed 2013.  Chemotherapy ongoing. Small cell lung cancer . History of bowel resection for Crohn's disease.  CT CHEST, ABDOMEN AND PELVIS WITH CONTRAST  Technique:  Multidetector CT imaging of the chest, abdomen and pelvis was performed following the standard protocol during bolus administration of intravenous contrast.  Contrast:  80 ml and Omnipaque 300  Comparison:  CT 06/02/2012 and on and back on the fat along the right at the   CT CHEST  Findings:  No axillary or left supraclavicular lymphadenopathy.  A port in the right anterior chest wall.  Small mediastinal lymph nodes are stable in size compared to prior.  Largest is a subcarinal node measuring 9  mm.  Again demonstrated the right hilar adenopathy measuring approximate 19 x 19 mm decreased from 24 x 22 cm.  The large mass extending from the right hilum along the fissure is decreased in size measuring 37 x 23 mm compared to 71 x 48 mm on prior.  Within the right upper lobe, there is an 11 mm nodule (image 21) which it is increased from approximate 4 mm on prior.  Nodularity surrounding the primary hilar lesion  is ill defined and is unchanged.  IMPRESSION:  1.  Interval significant decrease in size of dominant right hilar mass. 2.  Increase in size of right upper lobe pulmonary nodule consistent with disease progression.   CT ABDOMEN AND PELVIS  Findings:  No focal hepatic lesion.  The pancreas, spleen, adrenal glands are normal.  The enhancing mass in the medial upper left kidney measures 3.1 x 2.9 cm not changed from 3.0 x 2.5 cm on prior.  Several intermediate density cystic lesion associated with the left and right kidney which are not significantly changed.  The stomach, small bowel, cecum are normal.  There are surgical clips adjacent the cecum.  There is a left abdominal wall colostomy. Rectal pouch.  Abdominal aorta normal caliber.  No retroperitoneal periportal lymphadenopathy.  Review bone windows demonstrates no aggressive osseous  lesions.  IMPRESSION:  1.  No evidence of disease progression in the pelvis. 2.  Stable enhancing mass within the medial left kidney representing either metastasis or more likely renal cell carcinoma. 3.  Left lower quadrant colostomy without evidence of complication.  Original Report Authenticated By: Genevive Bi, M.D.      ASSESSMENT: This is a very pleasant 61 years old white male with extensive stage small cell lung cancer currently undergoing systemic chemotherapy with carboplatin and etoposide status post 2 cycles. The patient has significant improvement in his disease in the chest except for slightly enlarged the right upper lobe pulmonary nodule.    PLAN: I discussed the scan results with the patient and his niece and showed them the images. I recommended for him to continue on the same treatment regimen for now with carboplatin and etoposide. He was started cycle #3 today. The patient would come back for followup visit in 3 weeks with the start of cycle #4. He was advised to call me immediately if he has any concerning symptoms in the interval.  All questions were answered. The patient knows to call the clinic with any problems, questions or concerns. We can certainly see the patient much sooner if necessary.  I spent 15 minutes counseling the patient face to face. The total time spent in the appointment was 25 minutes.

## 2012-08-02 ENCOUNTER — Ambulatory Visit (HOSPITAL_BASED_OUTPATIENT_CLINIC_OR_DEPARTMENT_OTHER): Payer: Medicare Other

## 2012-08-02 VITALS — BP 97/50 | HR 89 | Temp 98.6°F | Resp 24

## 2012-08-02 DIAGNOSIS — C3490 Malignant neoplasm of unspecified part of unspecified bronchus or lung: Secondary | ICD-10-CM

## 2012-08-02 DIAGNOSIS — C3410 Malignant neoplasm of upper lobe, unspecified bronchus or lung: Secondary | ICD-10-CM

## 2012-08-02 DIAGNOSIS — Z5111 Encounter for antineoplastic chemotherapy: Secondary | ICD-10-CM

## 2012-08-02 MED ORDER — HEPARIN SOD (PORK) LOCK FLUSH 100 UNIT/ML IV SOLN
500.0000 [IU] | Freq: Once | INTRAVENOUS | Status: AC | PRN
  Administered 2012-08-02: 500 [IU]
  Filled 2012-08-02: qty 5

## 2012-08-02 MED ORDER — SODIUM CHLORIDE 0.9 % IV SOLN
Freq: Once | INTRAVENOUS | Status: AC
  Administered 2012-08-02: 15:00:00 via INTRAVENOUS

## 2012-08-02 MED ORDER — SODIUM CHLORIDE 0.9 % IJ SOLN
10.0000 mL | INTRAMUSCULAR | Status: DC | PRN
  Administered 2012-08-02: 10 mL
  Filled 2012-08-02: qty 10

## 2012-08-02 MED ORDER — SODIUM CHLORIDE 0.9 % IV SOLN
100.0000 mg/m2 | Freq: Once | INTRAVENOUS | Status: AC
  Administered 2012-08-02: 180 mg via INTRAVENOUS
  Filled 2012-08-02: qty 9

## 2012-08-02 MED ORDER — ONDANSETRON 8 MG/50ML IVPB (CHCC)
8.0000 mg | Freq: Once | INTRAVENOUS | Status: AC
  Administered 2012-08-02: 8 mg via INTRAVENOUS

## 2012-08-02 MED ORDER — DEXAMETHASONE SODIUM PHOSPHATE 10 MG/ML IJ SOLN
10.0000 mg | Freq: Once | INTRAMUSCULAR | Status: AC
  Administered 2012-08-02: 10 mg via INTRAVENOUS

## 2012-08-02 NOTE — Patient Instructions (Addendum)
[facility] Discharge Instructions for Patients Receiving Chemotherapy  Today you received the following chemotherapy agents [id number]  To help prevent nausea and vomiting after your treatment, we encourage you to take your nausea medication  and take it as often as prescribed   If you develop nausea and vomiting that is not controlled by your nausea medication, call the clinic. If it is after clinic hours your family physician or the after hours number for the clinic or go to the Emergency Department.   BELOW ARE SYMPTOMS THAT SHOULD BE REPORTED IMMEDIATELY:  *FEVER GREATER THAN 100.5 F  *CHILLS WITH OR WITHOUT FEVER  NAUSEA AND VOMITING THAT IS NOT CONTROLLED WITH YOUR NAUSEA MEDICATION  *UNUSUAL SHORTNESS OF BREATH  *UNUSUAL BRUISING OR BLEEDING  TENDERNESS IN MOUTH AND THROAT WITH OR WITHOUT PRESENCE OF ULCERS  *URINARY PROBLEMS  *BOWEL PROBLEMS  UNUSUAL RASH Items with * indicate a potential emergency and should be followed up as soon as possible.  One of the nurses will contact you 24 hours after your treatment. Please let the nurse know about any problems that you may have experienced. Feel free to call the clinic you have any questions or concerns. The clinic phone number is [phone].   I have been informed and understand all the instructions given to me. I know to contact the clinic, my physician, or go to the Emergency Department if any problems should occur. I do not have any questions at this time, but understand that I may call the clinic during office hours   should I have any questions or need assistance in obtaining follow up care.    __________________________________________  _____________  __________ Signature of Patient or Authorized Representative            Date                   Time    __________________________________________ Nurse's Signature    

## 2012-08-02 NOTE — Progress Notes (Signed)
Pre meds changed per dr Arbutus Ped to:  zofran 8mg  iv and decadron 10mg  iv.  Pharmacy notified.  dmr

## 2012-08-03 ENCOUNTER — Ambulatory Visit (HOSPITAL_BASED_OUTPATIENT_CLINIC_OR_DEPARTMENT_OTHER): Payer: Medicare Other

## 2012-08-03 VITALS — BP 150/71 | HR 109 | Temp 98.3°F | Resp 24

## 2012-08-03 DIAGNOSIS — C3410 Malignant neoplasm of upper lobe, unspecified bronchus or lung: Secondary | ICD-10-CM

## 2012-08-03 DIAGNOSIS — Z5111 Encounter for antineoplastic chemotherapy: Secondary | ICD-10-CM

## 2012-08-03 DIAGNOSIS — C3490 Malignant neoplasm of unspecified part of unspecified bronchus or lung: Secondary | ICD-10-CM

## 2012-08-03 MED ORDER — DEXAMETHASONE SODIUM PHOSPHATE 10 MG/ML IJ SOLN
10.0000 mg | Freq: Once | INTRAMUSCULAR | Status: AC
  Administered 2012-08-03: 10 mg via INTRAVENOUS

## 2012-08-03 MED ORDER — SODIUM CHLORIDE 0.9 % IV SOLN
Freq: Once | INTRAVENOUS | Status: AC
  Administered 2012-08-03: 15:00:00 via INTRAVENOUS

## 2012-08-03 MED ORDER — SODIUM CHLORIDE 0.9 % IV SOLN
100.0000 mg/m2 | Freq: Once | INTRAVENOUS | Status: AC
  Administered 2012-08-03: 180 mg via INTRAVENOUS
  Filled 2012-08-03: qty 9

## 2012-08-03 MED ORDER — ONDANSETRON 8 MG/50ML IVPB (CHCC)
8.0000 mg | Freq: Once | INTRAVENOUS | Status: AC
  Administered 2012-08-03: 8 mg via INTRAVENOUS

## 2012-08-04 ENCOUNTER — Ambulatory Visit (HOSPITAL_BASED_OUTPATIENT_CLINIC_OR_DEPARTMENT_OTHER): Payer: Medicare Other

## 2012-08-04 VITALS — BP 100/64 | HR 99 | Temp 97.3°F

## 2012-08-04 DIAGNOSIS — C3410 Malignant neoplasm of upper lobe, unspecified bronchus or lung: Secondary | ICD-10-CM

## 2012-08-04 DIAGNOSIS — C3490 Malignant neoplasm of unspecified part of unspecified bronchus or lung: Secondary | ICD-10-CM

## 2012-08-04 MED ORDER — PEGFILGRASTIM INJECTION 6 MG/0.6ML
6.0000 mg | Freq: Once | SUBCUTANEOUS | Status: AC
  Administered 2012-08-04: 6 mg via SUBCUTANEOUS
  Filled 2012-08-04: qty 0.6

## 2012-08-08 ENCOUNTER — Ambulatory Visit: Payer: Medicare Other | Admitting: Oncology

## 2012-08-08 ENCOUNTER — Other Ambulatory Visit (HOSPITAL_BASED_OUTPATIENT_CLINIC_OR_DEPARTMENT_OTHER): Payer: Medicare Other | Admitting: Lab

## 2012-08-08 ENCOUNTER — Other Ambulatory Visit: Payer: Self-pay | Admitting: Lab

## 2012-08-08 DIAGNOSIS — C3490 Malignant neoplasm of unspecified part of unspecified bronchus or lung: Secondary | ICD-10-CM

## 2012-08-08 LAB — CBC WITH DIFFERENTIAL/PLATELET
BASO%: 0.6 % (ref 0.0–2.0)
Eosinophils Absolute: 0 10*3/uL (ref 0.0–0.5)
HCT: 28.8 % — ABNORMAL LOW (ref 38.4–49.9)
LYMPH%: 8.5 % — ABNORMAL LOW (ref 14.0–49.0)
MCHC: 32.7 g/dL (ref 32.0–36.0)
MCV: 93.8 fL (ref 79.3–98.0)
MONO#: 0.3 10*3/uL (ref 0.1–0.9)
MONO%: 2.4 % (ref 0.0–14.0)
NEUT%: 88.4 % — ABNORMAL HIGH (ref 39.0–75.0)
Platelets: 248 10*3/uL (ref 140–400)
RBC: 3.07 10*6/uL — ABNORMAL LOW (ref 4.20–5.82)
WBC: 12.7 10*3/uL — ABNORMAL HIGH (ref 4.0–10.3)

## 2012-08-08 LAB — COMPREHENSIVE METABOLIC PANEL (CC13)
Alkaline Phosphatase: 199 U/L — ABNORMAL HIGH (ref 40–150)
CO2: 26 mEq/L (ref 22–29)
Creatinine: 1.2 mg/dL (ref 0.7–1.3)
Glucose: 85 mg/dl (ref 70–99)
Sodium: 139 mEq/L (ref 136–145)
Total Bilirubin: 0.9 mg/dL (ref 0.20–1.20)

## 2012-08-11 ENCOUNTER — Other Ambulatory Visit: Payer: Self-pay | Admitting: Internal Medicine

## 2012-08-11 DIAGNOSIS — C3490 Malignant neoplasm of unspecified part of unspecified bronchus or lung: Secondary | ICD-10-CM

## 2012-08-16 ENCOUNTER — Other Ambulatory Visit (HOSPITAL_BASED_OUTPATIENT_CLINIC_OR_DEPARTMENT_OTHER): Payer: Medicare Other

## 2012-08-16 ENCOUNTER — Other Ambulatory Visit: Payer: Medicare Other | Admitting: Lab

## 2012-08-16 DIAGNOSIS — C3490 Malignant neoplasm of unspecified part of unspecified bronchus or lung: Secondary | ICD-10-CM

## 2012-08-16 DIAGNOSIS — C3410 Malignant neoplasm of upper lobe, unspecified bronchus or lung: Secondary | ICD-10-CM

## 2012-08-16 LAB — CBC WITH DIFFERENTIAL/PLATELET
Basophils Absolute: 0 10*3/uL (ref 0.0–0.1)
Eosinophils Absolute: 0.1 10*3/uL (ref 0.0–0.5)
HCT: 23.4 % — ABNORMAL LOW (ref 38.4–49.9)
LYMPH%: 13.3 % — ABNORMAL LOW (ref 14.0–49.0)
MCV: 95 fL (ref 79.3–98.0)
MONO#: 1 10*3/uL — ABNORMAL HIGH (ref 0.1–0.9)
MONO%: 5 % (ref 0.0–14.0)
NEUT#: 16.9 10*3/uL — ABNORMAL HIGH (ref 1.5–6.5)
NEUT%: 81.2 % — ABNORMAL HIGH (ref 39.0–75.0)
Platelets: 56 10*3/uL — ABNORMAL LOW (ref 140–400)
WBC: 20.8 10*3/uL — ABNORMAL HIGH (ref 4.0–10.3)

## 2012-08-16 LAB — COMPREHENSIVE METABOLIC PANEL (CC13)
BUN: 14 mg/dL (ref 7.0–26.0)
CO2: 25 mEq/L (ref 22–29)
Creatinine: 1.3 mg/dL (ref 0.7–1.3)
Glucose: 99 mg/dl (ref 70–99)
Total Bilirubin: 0.2 mg/dL (ref 0.20–1.20)
Total Protein: 6.4 g/dL (ref 6.4–8.3)

## 2012-08-16 NOTE — Progress Notes (Signed)
Quick Note:  Call patient with the result. Will transfuse 2 units of PRBCs if symptomatic ______

## 2012-08-17 ENCOUNTER — Other Ambulatory Visit: Payer: Self-pay | Admitting: *Deleted

## 2012-08-17 ENCOUNTER — Encounter (HOSPITAL_COMMUNITY)
Admission: RE | Admit: 2012-08-17 | Discharge: 2012-08-17 | Disposition: A | Discharge status: Home / Self Care | Payer: Medicare Other | Source: Ambulatory Visit | Attending: Internal Medicine | Admitting: Internal Medicine

## 2012-08-17 DIAGNOSIS — D649 Anemia, unspecified: Secondary | ICD-10-CM

## 2012-08-17 NOTE — Progress Notes (Signed)
Hbg 7.8, pt aware he needs 2 units PRBC's this week.  HAR completed.  SLJ

## 2012-08-19 ENCOUNTER — Other Ambulatory Visit: Payer: Self-pay | Admitting: Oncology

## 2012-08-19 ENCOUNTER — Other Ambulatory Visit: Payer: Medicare Other | Admitting: Lab

## 2012-08-19 ENCOUNTER — Encounter: Payer: Self-pay | Admitting: Physician Assistant

## 2012-08-19 ENCOUNTER — Ambulatory Visit (HOSPITAL_BASED_OUTPATIENT_CLINIC_OR_DEPARTMENT_OTHER): Payer: Medicare Other | Admitting: Physician Assistant

## 2012-08-19 ENCOUNTER — Ambulatory Visit (HOSPITAL_COMMUNITY)
Admission: RE | Admit: 2012-08-19 | Discharge: 2012-08-19 | Disposition: A | Discharge status: Home / Self Care | Payer: Medicare Other | Source: Ambulatory Visit | Attending: Oncology | Admitting: Oncology

## 2012-08-19 ENCOUNTER — Telehealth: Payer: Self-pay | Admitting: Internal Medicine

## 2012-08-19 ENCOUNTER — Ambulatory Visit (HOSPITAL_BASED_OUTPATIENT_CLINIC_OR_DEPARTMENT_OTHER): Payer: Medicare Other

## 2012-08-19 VITALS — BP 126/73 | HR 74 | Temp 98.6°F | Resp 18

## 2012-08-19 DIAGNOSIS — C7A1 Malignant poorly differentiated neuroendocrine tumors: Secondary | ICD-10-CM

## 2012-08-19 DIAGNOSIS — M7989 Other specified soft tissue disorders: Secondary | ICD-10-CM

## 2012-08-19 DIAGNOSIS — D649 Anemia, unspecified: Secondary | ICD-10-CM

## 2012-08-19 DIAGNOSIS — I82629 Acute embolism and thrombosis of deep veins of unspecified upper extremity: Secondary | ICD-10-CM

## 2012-08-19 DIAGNOSIS — I82409 Acute embolism and thrombosis of unspecified deep veins of unspecified lower extremity: Secondary | ICD-10-CM

## 2012-08-19 DIAGNOSIS — C3490 Malignant neoplasm of unspecified part of unspecified bronchus or lung: Secondary | ICD-10-CM

## 2012-08-19 DIAGNOSIS — M79603 Pain in arm, unspecified: Secondary | ICD-10-CM

## 2012-08-19 DIAGNOSIS — D6481 Anemia due to antineoplastic chemotherapy: Secondary | ICD-10-CM

## 2012-08-19 DIAGNOSIS — I82B19 Acute embolism and thrombosis of unspecified subclavian vein: Secondary | ICD-10-CM | POA: Insufficient documentation

## 2012-08-19 DIAGNOSIS — T451X5A Adverse effect of antineoplastic and immunosuppressive drugs, initial encounter: Secondary | ICD-10-CM

## 2012-08-19 DIAGNOSIS — Y849 Medical procedure, unspecified as the cause of abnormal reaction of the patient, or of later complication, without mention of misadventure at the time of the procedure: Secondary | ICD-10-CM | POA: Insufficient documentation

## 2012-08-19 DIAGNOSIS — T82898A Other specified complication of vascular prosthetic devices, implants and grafts, initial encounter: Secondary | ICD-10-CM | POA: Insufficient documentation

## 2012-08-19 DIAGNOSIS — I82A19 Acute embolism and thrombosis of unspecified axillary vein: Secondary | ICD-10-CM | POA: Insufficient documentation

## 2012-08-19 LAB — PREPARE RBC (CROSSMATCH)

## 2012-08-19 MED ORDER — DIPHENHYDRAMINE HCL 50 MG/ML IJ SOLN
25.0000 mg | Freq: Once | INTRAMUSCULAR | Status: AC
  Administered 2012-08-19: 25 mg via INTRAVENOUS

## 2012-08-19 MED ORDER — WARFARIN SODIUM 5 MG PO TABS: ORAL_TABLET | ORAL | Status: DC

## 2012-08-19 MED ORDER — SODIUM CHLORIDE 0.9 % IV SOLN
250.0000 mL | Freq: Once | INTRAVENOUS | Status: AC
  Administered 2012-08-19: 250 mL via INTRAVENOUS

## 2012-08-19 MED ORDER — ENOXAPARIN SODIUM 100 MG/ML ~~LOC~~ SOLN
100.0000 mg | SUBCUTANEOUS | Status: AC
  Administered 2012-08-19: 100 mg via SUBCUTANEOUS
  Filled 2012-08-19: qty 1

## 2012-08-19 MED ORDER — ACETAMINOPHEN 325 MG PO TABS
650.0000 mg | ORAL_TABLET | Freq: Once | ORAL | Status: AC
  Administered 2012-08-19: 650 mg via ORAL

## 2012-08-19 MED ORDER — ENOXAPARIN SODIUM 100 MG/ML ~~LOC~~ SOLN: 100.0000 mg | SUBCUTANEOUS | Status: DC

## 2012-08-19 NOTE — Patient Instructions (Addendum)
Take Coumadin 5 mg by mouth daly between 4 and 6 pm starting tonight Begin daily Lovenox shots as directed Follow up in the Coumadin Clinic at the [facility] on 08/24/12

## 2012-08-19 NOTE — Telephone Encounter (Signed)
l/m with time for 9/11   aom

## 2012-08-19 NOTE — Progress Notes (Signed)
Patient gave self Lovenox injection.  Patient demonstrated injection correctly, reiterated learning via teach-back method.  Patient gives self B-12 injections monthly and is quite familiar with this practice; patient knows not to expel air from Lovenox syringe.

## 2012-08-19 NOTE — Progress Notes (Signed)
Pt c/o swelling and pain in R axilla. Area noted to be swollen and tender. R chest port a cath was not accessed. Tiana Loft PA notified and came to see pt. Dr Darrold Span notified by Dimas Alexandria and came to see pt. Doppler of RUE ordered and coordinated with vascular lab. Doppler was positive. Adrena notified. Pharmacy consulted. Pt to be started on lovenox and coumadin. Pt has past history of PE and understands about coumadin. Pt gives his brother insulin shots and gives himself B12 shots. He expressed willingness to give himself lovenox injections. Reported off to [facility] of pt needing to demonstrate self injection today before discharge. Pt also has recent history of phlebitis in R arm. The arm is mildly redder and swollen than L arm. His wife states the phlebitis is mostly gone.

## 2012-08-19 NOTE — Patient Instructions (Addendum)
Blood Transfusion Information  WHAT IS A BLOOD TRANSFUSION?  A transfusion is the replacement of blood or some of its parts. Blood is made up of multiple cells which provide different functions.   Red blood cells carry oxygen and are used for blood loss replacement.   White blood cells fight against infection.   Platelets control bleeding.   Plasma helps clot blood.   Other blood products are available for specialized needs, such as hemophilia or other clotting disorders.  BEFORE THE TRANSFUSION   Who gives blood for transfusions?    You may be able to donate blood to be used at a later date on yourself (autologous donation).   Relatives can be asked to donate blood. This is generally not any safer than if you have received blood from a stranger. The same precautions are taken to ensure safety when a relative's blood is donated.   Healthy volunteers who are fully evaluated to make sure their blood is safe. This is blood bank blood.  Transfusion therapy is the safest it has ever been in the practice of medicine. Before blood is taken from a donor, a complete history is taken to make sure that person has no history of diseases nor engages in risky social behavior (examples are intravenous drug use or sexual activity with multiple partners). The donor's travel history is screened to minimize risk of transmitting infections, such as malaria. The donated blood is tested for signs of infectious diseases, such as HIV and hepatitis. The blood is then tested to be sure it is compatible with you in order to minimize the chance of a transfusion reaction. If you or a relative donates blood, this is often done in anticipation of surgery and is not appropriate for emergency situations. It takes many days to process the donated blood.  RISKS AND COMPLICATIONS  Although transfusion therapy is very safe and saves many lives, the main dangers of transfusion include:    Getting an infectious disease.   Developing a  transfusion reaction. This is an allergic reaction to something in the blood you were given. Every precaution is taken to prevent this.  The decision to have a blood transfusion has been considered carefully by your caregiver before blood is given. Blood is not given unless the benefits outweigh the risks.  AFTER THE TRANSFUSION   Right after receiving a blood transfusion, you will usually feel much better and more energetic. This is especially true if your red blood cells have gotten low (anemic). The transfusion raises the level of the red blood cells which carry oxygen, and this usually causes an energy increase.   The nurse administering the transfusion will monitor you carefully for complications.  HOME CARE INSTRUCTIONS   No special instructions are needed after a transfusion. You may find your energy is better. Speak with your caregiver about any limitations on activity for underlying diseases you may have.  SEEK MEDICAL CARE IF:    Your condition is not improving after your transfusion.   You develop redness or irritation at the intravenous (IV) site.  SEEK IMMEDIATE MEDICAL CARE IF:   Any of the following symptoms occur over the next 12 hours:   Shaking chills.   You have a temperature by mouth above 102 F (38.9 C), not controlled by medicine.   Chest, back, or muscle pain.   People around you feel you are not acting correctly or are confused.   Shortness of breath or difficulty breathing.   Dizziness and fainting.     in urine output.   Your urine turns a dark color or changes to pink, red, or brown.  Any of the following symptoms occur over the next 10 days:  You have a temperature by mouth above 102 F (38.9 C), not controlled by medicine.   Shortness of breath.   Weakness after normal activity.   The white part of the eye turns yellow (jaundice).   You have a decrease in the amount of urine or are urinating less  often.   Your urine turns a dark color or changes to pink, red, or brown.  Document Released: 11/27/2000 Document Revised: 11/19/2011 Document Reviewed: 07/16/2008 [facility] Patient Information 2012 [place], [place].   Start taking coumadin tonight 9/6. You received injection of lovenox today. Start taking lovenox tomorrow 9/7.

## 2012-08-19 NOTE — Progress Notes (Signed)
VASCULAR LAB PRELIMINARY  PRELIMINARY  PRELIMINARY  PRELIMINARY  Right upper extremity venous duplex completed.    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 No evidence of superficial thrombosis. No evidence of propagation to the left subclavian.  Anthony [name], RVS 08/19/2012, 3:30 PM

## 2012-08-21 LAB — TYPE AND SCREEN
ABO/RH(D): AB POS
Antibody Screen: NEGATIVE
Unit division: 0

## 2012-08-22 ENCOUNTER — Ambulatory Visit (HOSPITAL_BASED_OUTPATIENT_CLINIC_OR_DEPARTMENT_OTHER): Payer: Medicare Other | Admitting: Physician Assistant

## 2012-08-22 ENCOUNTER — Telehealth: Payer: Self-pay | Admitting: Internal Medicine

## 2012-08-22 ENCOUNTER — Encounter: Payer: Self-pay | Admitting: Physician Assistant

## 2012-08-22 ENCOUNTER — Ambulatory Visit (HOSPITAL_BASED_OUTPATIENT_CLINIC_OR_DEPARTMENT_OTHER): Payer: Medicare Other

## 2012-08-22 ENCOUNTER — Other Ambulatory Visit (HOSPITAL_BASED_OUTPATIENT_CLINIC_OR_DEPARTMENT_OTHER): Payer: Medicare Other

## 2012-08-22 ENCOUNTER — Ambulatory Visit (HOSPITAL_COMMUNITY): Payer: Medicare Other

## 2012-08-22 VITALS — BP 130/72 | HR 109 | Temp 98.5°F | Resp 20 | Ht 70.5 in | Wt 143.0 lb

## 2012-08-22 DIAGNOSIS — C3490 Malignant neoplasm of unspecified part of unspecified bronchus or lung: Secondary | ICD-10-CM

## 2012-08-22 DIAGNOSIS — C3410 Malignant neoplasm of upper lobe, unspecified bronchus or lung: Secondary | ICD-10-CM

## 2012-08-22 DIAGNOSIS — I82B19 Acute embolism and thrombosis of unspecified subclavian vein: Secondary | ICD-10-CM

## 2012-08-22 DIAGNOSIS — I82409 Acute embolism and thrombosis of unspecified deep veins of unspecified lower extremity: Secondary | ICD-10-CM

## 2012-08-22 DIAGNOSIS — Z5111 Encounter for antineoplastic chemotherapy: Secondary | ICD-10-CM

## 2012-08-22 LAB — CBC WITH DIFFERENTIAL/PLATELET
BASO%: 0.3 % (ref 0.0–2.0)
Eosinophils Absolute: 0.1 10*3/uL (ref 0.0–0.5)
HCT: 30.9 % — ABNORMAL LOW (ref 38.4–49.9)
LYMPH%: 14.4 % (ref 14.0–49.0)
MCHC: 33 g/dL (ref 32.0–36.0)
MCV: 92 fL (ref 79.3–98.0)
MONO#: 1.1 10*3/uL — ABNORMAL HIGH (ref 0.1–0.9)
MONO%: 8.7 % (ref 0.0–14.0)
NEUT%: 76.2 % — ABNORMAL HIGH (ref 39.0–75.0)
Platelets: 199 10*3/uL (ref 140–400)
RBC: 3.36 10*6/uL — ABNORMAL LOW (ref 4.20–5.82)
WBC: 12.6 10*3/uL — ABNORMAL HIGH (ref 4.0–10.3)
nRBC: 0 % (ref 0–0)

## 2012-08-22 LAB — PROTIME-INR

## 2012-08-22 LAB — COMPREHENSIVE METABOLIC PANEL (CC13)
ALT: 7 U/L (ref 0–55)
CO2: 25 mEq/L (ref 22–29)
Calcium: 7.4 mg/dL — ABNORMAL LOW (ref 8.4–10.4)
Chloride: 103 mEq/L (ref 98–107)
Glucose: 92 mg/dl (ref 70–99)
Sodium: 138 mEq/L (ref 136–145)
Total Protein: 6.4 g/dL (ref 6.4–8.3)

## 2012-08-22 LAB — TECHNOLOGIST REVIEW

## 2012-08-22 MED ORDER — DEXAMETHASONE SODIUM PHOSPHATE 4 MG/ML IJ SOLN
20.0000 mg | Freq: Once | INTRAMUSCULAR | Status: AC
  Administered 2012-08-22: 20 mg via INTRAVENOUS

## 2012-08-22 MED ORDER — SODIUM CHLORIDE 0.9 % IV SOLN
395.5000 mg | Freq: Once | INTRAVENOUS | Status: AC
  Administered 2012-08-22: 400 mg via INTRAVENOUS
  Filled 2012-08-22: qty 40

## 2012-08-22 MED ORDER — SODIUM CHLORIDE 0.9 % IV SOLN
Freq: Once | INTRAVENOUS | Status: AC
  Administered 2012-08-22: 12:00:00 via INTRAVENOUS

## 2012-08-22 MED ORDER — SODIUM CHLORIDE 0.9 % IV SOLN
100.0000 mg/m2 | Freq: Once | INTRAVENOUS | Status: AC
  Administered 2012-08-22: 180 mg via INTRAVENOUS
  Filled 2012-08-22: qty 9

## 2012-08-22 MED ORDER — ONDANSETRON 16 MG/50ML IVPB (CHCC)
16.0000 mg | Freq: Once | INTRAVENOUS | Status: AC
  Administered 2012-08-22: 16 mg via INTRAVENOUS

## 2012-08-22 NOTE — Telephone Encounter (Signed)
Gave pt appt for September and October 2013 lab, chemo, ML and injections, gave pt appt for CT , pt got oral contrast and NPO 4 hours prior to CT. Called Tina @ IR and cancelled dye study per Forde Radon, ML

## 2012-08-22 NOTE — Progress Notes (Signed)
[facility] Telephone:([phone]) [phone]   Fax:([phone]) [phone]  OFFICE PROGRESS NOTE  Lillia Mountain, MD [address] [employer], [place]. [place] [address]  DIAGNOSIS:  1. Extensive stage small cell lung cancer diagnosed in June of 2013 2. Right upper extremity deep vein thrombosis diagnosed 08/19/2012  PRIOR THERAPY: None   CURRENT THERAPY:  1. Systemic chemotherapy with carboplatin for an AUC of 5 given on day 1 and etoposide at 100 mg per meter squared given on days one 2 and 3 with Neulasta support given on day 4 status post 3 cycles. 2. Coumadin at 5 mg by mouth daily or as directed by the [facility] Coumadin clinic currently with Lovenox 100 mg subcutaneous injection daily coverage   INTERVAL HISTORY: Anthony [name] 61 y.o. male returns to the clinic today for followup visit accompanied by 2 family members. The patient was at the cancer Center on 08/19/2012 receiving a blood transfusion. He complained of swelling under his right arm as well as in the forearm with discomfort. A right upper extremity Doppler was performed it revealed significant right subclavian deep vein thrombosis. His Port-A-Cath is also located on his side and appears to be involved in the thrombosis well. The patient was seen in conjunction with Dr. Darrold Span and started on Coumadin and Lovenox. He presents today to proceed with cycle #4 of his systemic chemotherapy with carboplatin and etoposide with Neulasta support. He does report a remote history of pulmonary emboli when he was in his 70s and being diagnosed with Crohn's disease. He notes some increase shortness of breath but globally feels a little better since receiving his blood transfusion. He voiced no other specific complaints today.  He denied having any significant nausea or vomiting, no fever or chills. He has no significant weight loss or night sweats.   MEDICAL HISTORY: Past Medical  History  Diagnosis Date  . Crohn's disease of colon   . Pulmonary emboli   . COPD (chronic obstructive pulmonary disease)   . Emphysema   . Seizure disorder   . Kidney mass   . Seizures   . Arthritis   . Emphysema of lung   . Weight loss, unintentional   . Cough   . Chest pain   . Weakness   . Cancer     lung     ALLERGIES:   has no known allergies.  MEDICATIONS:  Current Outpatient Prescriptions  Medication Sig Dispense Refill  . albuterol (PROVENTIL HFA;VENTOLIN HFA) 108 (90 BASE) MCG/ACT inhaler Inhale 2 puffs into the lungs every 6 (six) hours as needed. For shortness of breath.      . budesonide-formoterol (SYMBICORT) 160-4.5 MCG/ACT inhaler Inhale 2 puffs into the lungs 2 (two) times daily.      . Calcium Carbonate-Vitamin D (CALCIUM 600 + D PO) Take 1 tablet by mouth daily.      . cyanocobalamin (,VITAMIN B-12,) 1000 MCG/ML injection Inject into the muscle every 30 (thirty) days. Administers on the 15th of each month.      . enoxaparin (LOVENOX) 100 MG/ML injection Inject 1 mL (100 mg total) into the skin daily.  10 Syringe  0  . Hydrocodone-Acetaminophen (VICODIN HP) 10-300 MG TABS Take 1-2 tablets by mouth daily as needed. For pain.      Marland Kitchen lidocaine-prilocaine (EMLA) cream Apply topically as needed. Apply to port 1hr before chemo  30 g  0  . LORazepam (ATIVAN) 1 MG  tablet Take 1 mg by mouth 3 (three) times daily.      . phenytoin (DILANTIN) 100 MG ER capsule Take 200 mg by mouth 2 (two) times daily.      . phenytoin (DILANTIN) 30 MG ER capsule Take 60 mg by mouth 2 (two) times daily.      . potassium chloride SA (K-DUR,KLOR-CON) 20 MEQ tablet Take 1 tablet (20 mEq total) by mouth daily.  7 tablet  0  . prochlorperazine (COMPAZINE) 10 MG tablet Take 10 mg by mouth every 6 (six) hours as needed. For chemo-related nausea.      . prochlorperazine (COMPAZINE) 10 MG tablet TAKE ONE TABLET BY MOUTH EVERY SIX HOURS AS NEEDED  60 tablet  2  . promethazine (PHENERGAN) 25 MG tablet  Take 25 mg by mouth daily as needed. For nausea.      Marland Kitchen tiotropium (SPIRIVA) 18 MCG inhalation capsule Place 18 mcg into inhaler and inhale daily.      Marland Kitchen warfarin (COUMADIN) 5 MG tablet Take 1 tablet by mouth daily between 4:00 and 6:00 pm, or as directed  50 tablet  0    SURGICAL HISTORY:  Past Surgical History  Procedure Date  . Arm surgery     left  . Right knee   . Rectal surgery     x7  . Abdominal surgery     x3  . Video bronchoscopy 06/09/2012    Procedure: VIDEO BRONCHOSCOPY WITHOUT FLUORO;  Surgeon: Leslye Peer, MD;  Location: [facility];  Service: Cardiopulmonary;  Laterality: Bilateral;  . Portacath placement 07/05/2012    Procedure: INSERTION PORT-A-CATH;  Surgeon: Mariella Saa, MD;  Location: [facility] OR;  Service: General;  Laterality: N/A;    REVIEW OF SYSTEMS:  A comprehensive review of systems was negative except for: Constitutional: positive for fatigue Respiratory: positive for dyspnea on exertion   PHYSICAL EXAMINATION: General appearance: alert, cooperative and no distress Head: Normocephalic, without obvious abnormality, atraumatic Neck: no adenopathy Lymph nodes: Cervical, supraclavicular, and axillary nodes normal. Resp: clear to auscultation bilaterally Cardio: regular rate and rhythm, S1, S2 normal, no murmur, click, rub or gallop GI: soft, non-tender; bowel sounds normal; no masses,  no organomegaly Extremities: Right upper extremity with approximately a 2-2 and half centimeter soft tissue swelling in the right axilla is currently nontender there is a 1+ edema of the right forearm with palpable cording this mildly tender on the posterior aspect. There's no erythema or warmth noted Neurologic: Alert and oriented X 3, normal strength and tone. Normal symmetric reflexes. Normal coordination and gait  ECOG PERFORMANCE STATUS: 1 - Symptomatic but completely ambulatory  Blood pressure 130/72, pulse 109, temperature 98.5 F (36.9 C), temperature source  Oral, resp. rate 20, height 5' 10.5" (1.791 m), weight 143 lb (64.864 kg).  LABORATORY DATA: Lab Results  Component Value Date   WBC 12.6* 08/22/2012   HGB 10.2* 08/22/2012   HCT 30.9* 08/22/2012   MCV 92.0 08/22/2012   PLT 199 08/22/2012      Chemistry      Component Value Date/Time   NA 144 08/16/2012 1333   NA 142 08/01/2012 1205   K 4.1 08/16/2012 1333   K 4.1 08/01/2012 1205   CL 107 08/16/2012 1333   CL 103 08/01/2012 1205   CO2 25 08/16/2012 1333   CO2 26 08/01/2012 1205   BUN 14.0 08/16/2012 1333   BUN 15 08/01/2012 1205   CREATININE 1.3 08/16/2012 1333   CREATININE 1.22 08/01/2012 1205  Component Value Date/Time   CALCIUM 7.6* 08/16/2012 1333   CALCIUM 7.7* 08/01/2012 1205   ALKPHOS 151* 08/16/2012 1333   ALKPHOS 131* 08/01/2012 1205   AST 12 08/16/2012 1333   AST 13 08/01/2012 1205   ALT 9 08/16/2012 1333   ALT 10 08/01/2012 1205   BILITOT 0.20 08/16/2012 1333   BILITOT 0.2* 08/01/2012 1205       RADIOGRAPHIC STUDIES: Dg Chest 2 View  07/05/2012  *RADIOLOGY REPORT*  Clinical Data: Preop for Port-A-Cath insertion.  Lung cancer.  CHEST - 2 VIEW  Comparison: Chest CT 06/02/2012.  Findings: The cardiac silhouette, mediastinal and hilar contours are within normal limits.  There is a right hilar mass and a more peripherally located right pulmonary nodule.  No acute pulmonary findings.  Underlying emphysematous changes are noted.  IMPRESSION:  1.  Right lung lesion and hilar adenopathy. 2.  Emphysematous changes.  Original Report Authenticated By: P. Loralie Champagne, M.D.   Ct Chest W Contrast  07/29/2012  *RADIOLOGY REPORT*  Clinical Data:  Lung cancer diagnosed 2013.  Chemotherapy ongoing. Small cell lung cancer . History of bowel resection for Crohn's disease.  CT CHEST, ABDOMEN AND PELVIS WITH CONTRAST  Technique:  Multidetector CT imaging of the chest, abdomen and pelvis was performed following the standard protocol during bolus administration of intravenous contrast.  Contrast:  80 ml and Omnipaque  300  Comparison:  CT 06/02/2012 and on and back on the fat along the right at the   CT CHEST  Findings:  No axillary or left supraclavicular lymphadenopathy.  A port in the right anterior chest wall.  Small mediastinal lymph nodes are stable in size compared to prior.  Largest is a subcarinal node measuring 9 mm.  Again demonstrated the right hilar adenopathy measuring approximate 19 x 19 mm decreased from 24 x 22 cm.  The large mass extending from the right hilum along the fissure is decreased in size measuring 37 x 23 mm compared to 71 x 48 mm on prior.  Within the right upper lobe, there is an 11 mm nodule (image 21) which it is increased from approximate 4 mm on prior.  Nodularity surrounding the primary hilar lesion  is ill defined and is unchanged.  IMPRESSION:  1.  Interval significant decrease in size of dominant right hilar mass. 2.  Increase in size of right upper lobe pulmonary nodule consistent with disease progression.   CT ABDOMEN AND PELVIS  Findings:  No focal hepatic lesion.  The pancreas, spleen, adrenal glands are normal.  The enhancing mass in the medial upper left kidney measures 3.1 x 2.9 cm not changed from 3.0 x 2.5 cm on prior.  Several intermediate density cystic lesion associated with the left and right kidney which are not significantly changed.  The stomach, small bowel, cecum are normal.  There are surgical clips adjacent the cecum.  There is a left abdominal wall colostomy. Rectal pouch.  Abdominal aorta normal caliber.  No retroperitoneal periportal lymphadenopathy.  Review bone windows demonstrates no aggressive osseous lesions.  IMPRESSION:  1.  No evidence of disease progression in the pelvis. 2.  Stable enhancing mass within the medial left kidney representing either metastasis or more likely renal cell carcinoma. 3.  Left lower quadrant colostomy without evidence of complication.  Original Report Authenticated By: Genevive Bi, M.D.      ASSESSMENT/PLAN: This is a very  pleasant 61 years old white male with extensive stage small cell lung cancer currently undergoing systemic chemotherapy  with carboplatin and etoposide status post 3 cycles. The patient has significant improvement in his disease in the chest except for slightly enlarged the right upper lobe pulmonary nodule. Patient was discussed with Dr. Arbutus Ped. The patient now has a right subclavian deep vein thrombosis and is currently on treatment with both Lovenox and Coumadin, being followed by the [facility] Coumadin clinic. His Port-A-Cath was placed by Dr. Johna Sheriff and I've been in contact with Dr. Dillard [place] office requesting a Port-A-Cath to be removed. That office will contact us with specifics about removal of the Port-A-Cath and this will be communicated with the patient. They are aware that the patient is actively receiving chemotherapy and will try to schedule it accordingly. Patient will continue with weekly labs consisting of CBC differential and C. met. He will followup with Dr. Arbutus Ped in 3 weeks prior to cycle #5 of his systemic chemotherapy with carboplatin and etoposide with Neulasta support with a repeat CT of the chest abdomen and pelvis with contrast to reevaluate his disease.  Laural Benes, Nieve Rojero E, PA-C   All questions were answered. The patient knows to call the clinic with any problems, questions or concerns. We can certainly see the patient much sooner if necessary.  I spent 20 minutes counseling the patient face to face. The total time spent in the appointment was 30 minutes.

## 2012-08-22 NOTE — Patient Instructions (Signed)
Patient aware of next appointment; discharged home. 

## 2012-08-22 NOTE — Patient Instructions (Addendum)
Follow up with Dr.Mohamed in 3 weeks with a restaging CT scan of your chest, abdomen and pelvis

## 2012-08-23 ENCOUNTER — Other Ambulatory Visit: Payer: Medicare Other | Admitting: Lab

## 2012-08-23 ENCOUNTER — Ambulatory Visit (HOSPITAL_BASED_OUTPATIENT_CLINIC_OR_DEPARTMENT_OTHER): Payer: Medicare Other

## 2012-08-23 ENCOUNTER — Other Ambulatory Visit (INDEPENDENT_AMBULATORY_CARE_PROVIDER_SITE_OTHER): Payer: Self-pay | Admitting: General Surgery

## 2012-08-23 ENCOUNTER — Ambulatory Visit: Payer: Medicare Other | Admitting: Nutrition

## 2012-08-23 VITALS — BP 154/61 | HR 98 | Temp 97.9°F | Resp 20

## 2012-08-23 DIAGNOSIS — C7A1 Malignant poorly differentiated neuroendocrine tumors: Secondary | ICD-10-CM

## 2012-08-23 DIAGNOSIS — Z5111 Encounter for antineoplastic chemotherapy: Secondary | ICD-10-CM

## 2012-08-23 DIAGNOSIS — C3490 Malignant neoplasm of unspecified part of unspecified bronchus or lung: Secondary | ICD-10-CM

## 2012-08-23 MED ORDER — DEXAMETHASONE SODIUM PHOSPHATE 10 MG/ML IJ SOLN
10.0000 mg | Freq: Once | INTRAMUSCULAR | Status: AC
  Administered 2012-08-23: 10 mg via INTRAVENOUS

## 2012-08-23 MED ORDER — SODIUM CHLORIDE 0.9 % IV SOLN
100.0000 mg/m2 | Freq: Once | INTRAVENOUS | Status: AC
  Administered 2012-08-23: 180 mg via INTRAVENOUS
  Filled 2012-08-23: qty 9

## 2012-08-23 MED ORDER — ONDANSETRON 8 MG/50ML IVPB (CHCC)
8.0000 mg | Freq: Once | INTRAVENOUS | Status: AC
  Administered 2012-08-23: 8 mg via INTRAVENOUS

## 2012-08-23 MED ORDER — SODIUM CHLORIDE 0.9 % IV SOLN
Freq: Once | INTRAVENOUS | Status: AC
  Administered 2012-08-23: 20 mL via INTRAVENOUS

## 2012-08-23 NOTE — Progress Notes (Signed)
Anthony [name] denies any nutrition side effects at this time.  He is able to eat and his weight is relatively stable, documented as 143 pounds on September 9th.  Previous weight of 144 pounds documented August 19th. The patient has no nutrition concerns or questions.  NUTRITION DIAGNOSIS:  Unintended weight loss, improved.  INTERVENTION:  The patient was encouraged and educated to continue smaller, more frequent meals, increasing calories and protein to provide weight maintenance.  Patient able to verbalize strategies for eating throughout the day.  MONITORING/EVALUATION/GOALS:  The patient will continue to tolerate oral intake to promote weight maintenance.  NEXT VISIT:  Tuesday, October 1st, during chemotherapy.   ______________________________ Zenovia Jarred, RD, CSO, LDN Clinical Nutrition Specialist BN/MEDQ  D:  08/23/2012  T:  08/23/2012  Job:  [id number]

## 2012-08-24 ENCOUNTER — Encounter (HOSPITAL_BASED_OUTPATIENT_CLINIC_OR_DEPARTMENT_OTHER)
Admission: RE | Disposition: A | Discharge status: Home / Self Care | Payer: Self-pay | Source: Ambulatory Visit | Attending: General Surgery

## 2012-08-24 ENCOUNTER — Ambulatory Visit (HOSPITAL_BASED_OUTPATIENT_CLINIC_OR_DEPARTMENT_OTHER): Payer: Medicare Other | Admitting: Pharmacist

## 2012-08-24 ENCOUNTER — Other Ambulatory Visit (HOSPITAL_BASED_OUTPATIENT_CLINIC_OR_DEPARTMENT_OTHER): Payer: Medicare Other

## 2012-08-24 ENCOUNTER — Ambulatory Visit (HOSPITAL_BASED_OUTPATIENT_CLINIC_OR_DEPARTMENT_OTHER)
Admission: RE | Admit: 2012-08-24 | Discharge: 2012-08-24 | Disposition: A | Discharge status: Home / Self Care | Payer: Medicare Other | Source: Ambulatory Visit | Attending: General Surgery | Admitting: General Surgery

## 2012-08-24 ENCOUNTER — Ambulatory Visit (HOSPITAL_BASED_OUTPATIENT_CLINIC_OR_DEPARTMENT_OTHER): Payer: Medicare Other

## 2012-08-24 DIAGNOSIS — I82409 Acute embolism and thrombosis of unspecified deep veins of unspecified lower extremity: Secondary | ICD-10-CM

## 2012-08-24 DIAGNOSIS — C7A1 Malignant poorly differentiated neuroendocrine tumors: Secondary | ICD-10-CM

## 2012-08-24 DIAGNOSIS — Z5111 Encounter for antineoplastic chemotherapy: Secondary | ICD-10-CM

## 2012-08-24 DIAGNOSIS — J449 Chronic obstructive pulmonary disease, unspecified: Secondary | ICD-10-CM | POA: Insufficient documentation

## 2012-08-24 DIAGNOSIS — Z86718 Personal history of other venous thrombosis and embolism: Secondary | ICD-10-CM | POA: Insufficient documentation

## 2012-08-24 DIAGNOSIS — C3490 Malignant neoplasm of unspecified part of unspecified bronchus or lung: Secondary | ICD-10-CM

## 2012-08-24 DIAGNOSIS — Z452 Encounter for adjustment and management of vascular access device: Secondary | ICD-10-CM | POA: Insufficient documentation

## 2012-08-24 DIAGNOSIS — I82629 Acute embolism and thrombosis of deep veins of unspecified upper extremity: Secondary | ICD-10-CM

## 2012-08-24 DIAGNOSIS — J4489 Other specified chronic obstructive pulmonary disease: Secondary | ICD-10-CM | POA: Insufficient documentation

## 2012-08-24 DIAGNOSIS — Z85118 Personal history of other malignant neoplasm of bronchus and lung: Secondary | ICD-10-CM | POA: Insufficient documentation

## 2012-08-24 HISTORY — PX: PORT-A-CATH REMOVAL: SHX5289

## 2012-08-24 LAB — CBC WITH DIFFERENTIAL/PLATELET
BASO%: 0.2 % (ref 0.0–2.0)
Basophils Absolute: 0 10*3/uL (ref 0.0–0.1)
EOS%: 0.1 % (ref 0.0–7.0)
MCH: 30.4 pg (ref 27.2–33.4)
MCHC: 32.4 g/dL (ref 32.0–36.0)
MCV: 93.9 fL (ref 79.3–98.0)
MONO%: 4.9 % (ref 0.0–14.0)
RBC: 3.12 10*6/uL — ABNORMAL LOW (ref 4.20–5.82)
RDW: 18.8 % — ABNORMAL HIGH (ref 11.0–14.6)
lymph#: 1.9 10*3/uL (ref 0.9–3.3)
nRBC: 0 % (ref 0–0)

## 2012-08-24 LAB — COMPREHENSIVE METABOLIC PANEL (CC13)
BUN: 15 mg/dL (ref 7.0–26.0)
CO2: 24 mEq/L (ref 22–29)
Glucose: 92 mg/dl (ref 70–99)
Sodium: 142 mEq/L (ref 136–145)
Total Bilirubin: 0.3 mg/dL (ref 0.20–1.20)
Total Protein: 6.5 g/dL (ref 6.4–8.3)

## 2012-08-24 LAB — PROTIME-INR
INR: 4.4 — ABNORMAL HIGH (ref 2.00–3.50)
Protime: 52.8 Seconds — ABNORMAL HIGH (ref 10.6–13.4)

## 2012-08-24 SURGERY — MINOR REMOVAL PORT-A-CATH
Anesthesia: LOCAL | Site: Chest | Laterality: Right | Wound class: Clean

## 2012-08-24 MED ORDER — SODIUM BICARBONATE 4 % IV SOLN
INTRAVENOUS | Status: DC | PRN
  Administered 2012-08-24: 13:00:00 via INTRAMUSCULAR

## 2012-08-24 MED ORDER — SODIUM CHLORIDE 0.9 % IV SOLN
100.0000 mg/m2 | Freq: Once | INTRAVENOUS | Status: AC
  Administered 2012-08-24: 180 mg via INTRAVENOUS
  Filled 2012-08-24: qty 9

## 2012-08-24 MED ORDER — SODIUM CHLORIDE 0.9 % IV SOLN
Freq: Once | INTRAVENOUS | Status: AC
  Administered 2012-08-24: 14:00:00 via INTRAVENOUS

## 2012-08-24 MED ORDER — ONDANSETRON 8 MG/50ML IVPB (CHCC)
8.0000 mg | Freq: Once | INTRAVENOUS | Status: AC
  Administered 2012-08-24: 8 mg via INTRAVENOUS

## 2012-08-24 MED ORDER — DEXAMETHASONE SODIUM PHOSPHATE 10 MG/ML IJ SOLN
10.0000 mg | Freq: Once | INTRAMUSCULAR | Status: AC
  Administered 2012-08-24: 10 mg via INTRAVENOUS

## 2012-08-24 SURGICAL SUPPLY — 23 items
BLADE SURG 15 STRL LF DISP TIS (BLADE) ×1 IMPLANT
BLADE SURG 15 STRL SS (BLADE) ×1
CHLORAPREP W/TINT 26ML (MISCELLANEOUS) IMPLANT
CLOTH BEACON ORANGE TIMEOUT ST (SAFETY) ×2 IMPLANT
DERMABOND ADVANCED (GAUZE/BANDAGES/DRESSINGS) ×1
DERMABOND ADVANCED .7 DNX12 (GAUZE/BANDAGES/DRESSINGS) ×1 IMPLANT
ELECT REM PT RETURN 9FT ADLT (ELECTROSURGICAL)
ELECTRODE REM PT RTRN 9FT ADLT (ELECTROSURGICAL) IMPLANT
GLOVE BIOGEL PI IND STRL 8 (GLOVE) ×1 IMPLANT
GLOVE BIOGEL PI INDICATOR 8 (GLOVE) ×1
GLOVE SKINSENSE NS SZ7.0 (GLOVE) ×1
GLOVE SKINSENSE STRL SZ7.0 (GLOVE) ×1 IMPLANT
GLOVE SS BIOGEL STRL SZ 7.5 (GLOVE) ×1 IMPLANT
GLOVE SUPERSENSE BIOGEL SZ 7.5 (GLOVE) ×1
NEEDLE HYPO 25X1 1.5 SAFETY (NEEDLE) ×2 IMPLANT
PENCIL BUTTON HOLSTER BLD 10FT (ELECTRODE) IMPLANT
SUT ETHILON 4 0 PS 2 18 (SUTURE) ×2 IMPLANT
SUT MON AB 4-0 PC3 18 (SUTURE) ×2 IMPLANT
SUT VIC AB 3-0 SH 27 (SUTURE) ×1
SUT VIC AB 3-0 SH 27X BRD (SUTURE) ×1 IMPLANT
SWABSTICK POVIDONE IODINE SNGL (MISCELLANEOUS) ×4 IMPLANT
SYR CONTROL 10ML LL (SYRINGE) ×2 IMPLANT
TOWEL OR 17X24 6PK STRL BLUE (TOWEL DISPOSABLE) IMPLANT

## 2012-08-24 NOTE — Op Note (Signed)
Pre op diagnosis: status post Port-A-Cath  Postop diagnosis: Same  Surgical procedure: Removal of Port-A-Cath  Surgeon: Glenna Fellows  Anesthesia: Local  Description of procedure: The patient is positioned supine and the anterior chest sterilely prepped and draped.Patient and procedure were verified. Local anesthesia was used to infiltrate the skin and underlying soft tissue. The previous incision was opened and sharp dissection carried down onto the port and catheter. The catheter was withdrawn intact. The port was sharply dissected out of the subcutaneous tissue and removed with all suture material. The subcutaneous tissue was closed with running 3-0 Vicryl and the skin was closed with running 4-0 nylon. Patient tolerated the procedure well.   Anthony Saa MD, FACS  08/24/2012

## 2012-08-24 NOTE — H&P (Signed)
  Subjective:     Anthony [name] is a 61 y.o. male who presents for Port-A-Cath removal after developing DVT in his right subclavian vein.  Past Medical History  Diagnosis Date  . Crohn's disease of colon   . Pulmonary emboli   . COPD (chronic obstructive pulmonary disease)   . Emphysema   . Seizure disorder   . Kidney mass   . Seizures   . Arthritis   . Emphysema of lung   . Weight loss, unintentional   . Cough   . Chest pain   . Weakness   . Cancer     lung    Past Surgical History  Procedure Date  . Arm surgery     left  . Right knee   . Rectal surgery     x7  . Abdominal surgery     x3  . Video bronchoscopy 06/09/2012    Procedure: VIDEO BRONCHOSCOPY WITHOUT FLUORO;  Surgeon: Leslye Peer, MD;  Location: [facility];  Service: Cardiopulmonary;  Laterality: Bilateral;  . Portacath placement 07/05/2012    Procedure: INSERTION PORT-A-CATH;  Surgeon: Mariella Saa, MD;  Location: [facility] OR;  Service: General;  Laterality: N/A;    Objective:    BP 134/82  Pulse 100  Temp 98 F (36.7 C) (Oral)  Resp 18  SpO2 98%  General:  alert, cooperative and no distress  Skin:  normal           Lungs:  clear to auscultation bilaterally  Heart:  regular rate and rhythm           Extremities:  edema mild RUE  Neurologic:  negative  Psychiatric:  normal mood, behavior, speech, dress, and thought processes     Assessment:    DVT R subclavian v post portacath    Plan:   Removal of Port-A-Cath under local anesthesia.. The patient understands the risks, any and all questions were answered to the patient's satisfaction.

## 2012-08-24 NOTE — Progress Notes (Signed)
INR supratherapeutic today (4.4) on 5mg  daily. INR drawn on 9/9 = 2.5.  Pt has only been on Coumadin x 6 days so far.  He has been on Lovenox 100mg  SQ daily.   No problems with bleeding or bruising besides small bruises on abdomen from Lovenox injections.  No missed doses.  No changes in meds or diet. OK to stop Lovenox. Will have pt hold Coumadin x 2 days, then resume Coumadin at 2.5mg  daily. Recheck INR on 08/29/12 with existing lab appt.

## 2012-08-24 NOTE — Patient Instructions (Addendum)
[facility] Discharge Instructions for Patients Receiving Chemotherapy  Today you received the following chemotherapy agents: Etoposide   To help prevent nausea and vomiting after your treatment, we encourage you to take your nausea medication. Take it as often as prescribed.   hours.   If you develop nausea and vomiting that is not controlled by your nausea medication, call the clinic. If it is after clinic hours your family physician or the after hours number for the clinic or go to the Emergency Department.   BELOW ARE SYMPTOMS THAT SHOULD BE REPORTED IMMEDIATELY:  *FEVER GREATER THAN 100.5 F  *CHILLS WITH OR WITHOUT FEVER  NAUSEA AND VOMITING THAT IS NOT CONTROLLED WITH YOUR NAUSEA MEDICATION  *UNUSUAL SHORTNESS OF BREATH  *UNUSUAL BRUISING OR BLEEDING  TENDERNESS IN MOUTH AND THROAT WITH OR WITHOUT PRESENCE OF ULCERS  *URINARY PROBLEMS  *BOWEL PROBLEMS  UNUSUAL RASH Items with * indicate a potential emergency and should be followed up as soon as possible.  Feel free to call the clinic you have any questions or concerns. The clinic phone number is [phone].   I have been informed and understand all the instructions given to me. I know to contact the clinic, my physician, or go to the Emergency Department if any problems should occur. I do not have any questions at this time, but understand that I may call the clinic during office hours   should I have any questions or need assistance in obtaining follow up care.    __________________________________________  _____________  __________ Signature of Patient or Authorized Representative            Date                   Time    __________________________________________ Nurse's Signature

## 2012-08-24 NOTE — Progress Notes (Signed)
Pt tolerated treatment well. Discharged to home.

## 2012-08-25 ENCOUNTER — Ambulatory Visit (HOSPITAL_BASED_OUTPATIENT_CLINIC_OR_DEPARTMENT_OTHER): Payer: Medicare Other

## 2012-08-25 ENCOUNTER — Encounter (HOSPITAL_BASED_OUTPATIENT_CLINIC_OR_DEPARTMENT_OTHER): Payer: Self-pay | Admitting: General Surgery

## 2012-08-25 VITALS — BP 110/65 | HR 104 | Temp 97.7°F

## 2012-08-25 DIAGNOSIS — Z5189 Encounter for other specified aftercare: Secondary | ICD-10-CM

## 2012-08-25 DIAGNOSIS — C3490 Malignant neoplasm of unspecified part of unspecified bronchus or lung: Secondary | ICD-10-CM

## 2012-08-25 DIAGNOSIS — C7A1 Malignant poorly differentiated neuroendocrine tumors: Secondary | ICD-10-CM

## 2012-08-25 MED ORDER — PEGFILGRASTIM INJECTION 6 MG/0.6ML
6.0000 mg | Freq: Once | SUBCUTANEOUS | Status: AC
  Administered 2012-08-25: 6 mg via SUBCUTANEOUS
  Filled 2012-08-25: qty 0.6

## 2012-08-26 ENCOUNTER — Telehealth: Payer: Self-pay | Admitting: Medical Oncology

## 2012-08-26 NOTE — Progress Notes (Signed)
[facility] [phone]   [phone]  OFFICE PROGRESS NOTE  Anthony Mountain, MD [address] [employer] [facility], [place]. [place] [address]  DIAGNOSIS: Extensive stage small cell lung cancer diagnosed in June of 2013  PRIOR THERAPY: None   CURRENT THERAPY: Systemic chemotherapy with carboplatin for an AUC of 5 given on day 1 and etoposide at 100 mg per meter squared given on days one 2 and 3 with Neulasta support given on day 4 status post 3 cycles.   INTERVAL HISTORY: Anthony [name] 61 y.o. male returns to the clinic today for followup visit accompanied by his niece. He is in the infusion room receiving a blood transfusion for chemotherapy-induced anemia. I was called to the infusion room to evaluate [name] complaint of a right upper extremity axillary swelling and right forearm discomfort. He states that he just noticed a swelling last night. His Port-A-Cath is in the right upper chest. He feels that the area around the Port-A-Cath is "puffy". He denied fever or chills. He voiced no other specific complaints. He does report that he had a remote history of pulmonary emboli in his 80s was treated with Lovenox and Coumadin.   MEDICAL HISTORY: Past Medical History  Diagnosis Date  . Crohn's disease of colon   . Pulmonary emboli   . COPD (chronic obstructive pulmonary disease)   . Emphysema   . Seizure disorder   . Kidney mass   . Seizures   . Arthritis   . Emphysema of lung   . Weight loss, unintentional   . Cough   . Chest pain   . Weakness   . Cancer     lung     ALLERGIES:   has no known allergies.  MEDICATIONS:  Current Outpatient Prescriptions  Medication Sig Dispense Refill  . albuterol (PROVENTIL HFA;VENTOLIN HFA) 108 (90 BASE) MCG/ACT inhaler Inhale 2 puffs into the lungs every 6 (six) hours as needed. For shortness of breath.      . budesonide-formoterol (SYMBICORT) 160-4.5 MCG/ACT inhaler  Inhale 2 puffs into the lungs 2 (two) times daily.      . Calcium Carbonate-Vitamin D (CALCIUM 600 + D PO) Take 1 tablet by mouth daily.      . cyanocobalamin (,VITAMIN B-12,) 1000 MCG/ML injection Inject into the muscle every 30 (thirty) days. Administers on the 15th of each month.      . enoxaparin (LOVENOX) 100 MG/ML injection Inject 1 mL (100 mg total) into the skin daily.  10 Syringe  0  . Hydrocodone-Acetaminophen (VICODIN HP) 10-300 MG TABS Take 1-2 tablets by mouth daily as needed. For pain.      Marland Kitchen lidocaine-prilocaine (EMLA) cream Apply topically as needed. Apply to port 1hr before chemo  30 g  0  . LORazepam (ATIVAN) 1 MG tablet Take 1 mg by mouth 3 (three) times daily.      . phenytoin (DILANTIN) 100 MG ER capsule Take 200 mg by mouth 2 (two) times daily.      . phenytoin (DILANTIN) 30 MG ER capsule Take 60 mg by mouth 2 (two) times daily.      . potassium chloride SA (K-DUR,KLOR-CON) 20 MEQ tablet Take 1 tablet (20 mEq total) by mouth daily.  7 tablet  0  . prochlorperazine (COMPAZINE) 10 MG tablet Take 10 mg by mouth every 6 (six) hours as needed. For chemo-related nausea.      . prochlorperazine (COMPAZINE) 10  MG tablet TAKE ONE TABLET BY MOUTH EVERY SIX HOURS AS NEEDED  60 tablet  2  . promethazine (PHENERGAN) 25 MG tablet Take 25 mg by mouth daily as needed. For nausea.      Marland Kitchen tiotropium (SPIRIVA) 18 MCG inhalation capsule Place 18 mcg into inhaler and inhale daily.      Marland Kitchen warfarin (COUMADIN) 5 MG tablet Take 1 tablet by mouth daily between 4:00 and 6:00 pm, or as directed  50 tablet  0   No current facility-administered medications for this visit.   Facility-Administered Medications Ordered in Other Visits  Medication Dose Route Frequency Provider Last Rate Last Dose  . pegfilgrastim (NEULASTA) injection 6 mg  6 mg Subcutaneous Once Conni Slipper, PA   6 mg at 08/25/12 1230  . DISCONTD: Sodium bicarb 4% (5 mL) with Marcaine 0.25% w/EPINEPHrine 1:200,000 (30 mL) and lidocaine  1% (30 mL) injection    PRN Mariella Saa, MD        SURGICAL HISTORY:  Past Surgical History  Procedure Date  . Arm surgery     left  . Right knee   . Rectal surgery     x7  . Abdominal surgery     x3  . Video bronchoscopy 06/09/2012    Procedure: VIDEO BRONCHOSCOPY WITHOUT FLUORO;  Surgeon: Leslye Peer, MD;  Location: [facility];  Service: Cardiopulmonary;  Laterality: Bilateral;  . Portacath placement 07/05/2012    Procedure: INSERTION PORT-A-CATH;  Surgeon: Mariella Saa, MD;  Location: [facility];  Service: General;  Laterality: N/A;  . Port-a-cath removal 08/24/2012    Procedure: MINOR REMOVAL PORT-A-CATH;  Surgeon: Mariella Saa, MD;  Location: [facility];  Service: General;  Laterality: Right;    REVIEW OF SYSTEMS:  Pertinent items are noted in HPI.   PHYSICAL EXAMINATION: General appearance: alert, cooperative and no distress Head: Normocephalic, without obvious abnormality, atraumatic Neck: no adenopathy Lymph nodes: Cervical, supraclavicular, and axillary nodes normal. Resp: clear to auscultation bilaterally Cardio: regular rate and rhythm, S1, S2 normal, no murmur, click, rub or gallop GI: soft, non-tender; bowel sounds normal; no masses,  no organomegaly Extremities: Right axilla with an approximately 2-3 cm soft tissue mass no erythema or warmth, tender to palpation. There is also slight edema the right upper extremity with an old cord in the forearm at previous site of an IV for her Phenergan when he had his bronchoscopy. There is no distinct swelling in the supraclavicular region Neurologic: Alert and oriented X 3, normal strength and tone. Normal symmetric reflexes. Normal coordination and gait  ECOG PERFORMANCE STATUS: 1 - Symptomatic but completely ambulatory  There were no vitals taken for this visit.  LABORATORY DATA: Lab Results  Component Value Date   WBC 11.8* 08/24/2012   HGB 9.5* 08/24/2012   HCT 29.3* 08/24/2012   MCV  93.9 08/24/2012   PLT 289 08/24/2012      Chemistry      Component Value Date/Time   NA 142 08/24/2012 1327   NA 142 08/01/2012 1205   K 3.7 08/24/2012 1327   K 4.1 08/01/2012 1205   CL 106 08/24/2012 1327   CL 103 08/01/2012 1205   CO2 24 08/24/2012 1327   CO2 26 08/01/2012 1205   BUN 15.0 08/24/2012 1327   BUN 15 08/01/2012 1205   CREATININE 1.1 08/24/2012 1327   CREATININE 1.22 08/01/2012 1205      Component Value Date/Time   CALCIUM 8.2* 08/24/2012 1327   CALCIUM  7.7* 08/01/2012 1205   ALKPHOS 123 08/24/2012 1327   ALKPHOS 131* 08/01/2012 1205   AST 13 08/24/2012 1327   AST 13 08/01/2012 1205   ALT 11 08/24/2012 1327   ALT 10 08/01/2012 1205   BILITOT 0.30 08/24/2012 1327   BILITOT 0.2* 08/01/2012 1205       RADIOGRAPHIC STUDIES: Dg Chest 2 View  07/05/2012  *RADIOLOGY REPORT*  Clinical Data: Preop for Port-A-Cath insertion.  Lung cancer.  CHEST - 2 VIEW  Comparison: Chest CT 06/02/2012.  Findings: The cardiac silhouette, mediastinal and hilar contours are within normal limits.  There is a right hilar mass and a more peripherally located right pulmonary nodule.  No acute pulmonary findings.  Underlying emphysematous changes are noted.  IMPRESSION:  1.  Right lung lesion and hilar adenopathy. 2.  Emphysematous changes.  Original Report Authenticated By: P. Loralie Champagne, M.D.   Ct Chest W Contrast  07/29/2012  *RADIOLOGY REPORT*  Clinical Data:  Lung cancer diagnosed 2013.  Chemotherapy ongoing. Small cell lung cancer . History of bowel resection for Crohn's disease.  CT CHEST, ABDOMEN AND PELVIS WITH CONTRAST  Technique:  Multidetector CT imaging of the chest, abdomen and pelvis was performed following the standard protocol during bolus administration of intravenous contrast.  Contrast:  80 ml and Omnipaque 300  Comparison:  CT 06/02/2012 and on and back on the fat along the right at the   CT CHEST  Findings:  No axillary or left supraclavicular lymphadenopathy.  A port in the right anterior  chest wall.  Small mediastinal lymph nodes are stable in size compared to prior.  Largest is a subcarinal node measuring 9 mm.  Again demonstrated the right hilar adenopathy measuring approximate 19 x 19 mm decreased from 24 x 22 cm.  The large mass extending from the right hilum along the fissure is decreased in size measuring 37 x 23 mm compared to 71 x 48 mm on prior.  Within the right upper lobe, there is an 11 mm nodule (image 21) which it is increased from approximate 4 mm on prior.  Nodularity surrounding the primary hilar lesion  is ill defined and is unchanged.  IMPRESSION:  1.  Interval significant decrease in size of dominant right hilar mass. 2.  Increase in size of right upper lobe pulmonary nodule consistent with disease progression.   CT ABDOMEN AND PELVIS  Findings:  No focal hepatic lesion.  The pancreas, spleen, adrenal glands are normal.  The enhancing mass in the medial upper left kidney measures 3.1 x 2.9 cm not changed from 3.0 x 2.5 cm on prior.  Several intermediate density cystic lesion associated with the left and right kidney which are not significantly changed.  The stomach, small bowel, cecum are normal.  There are surgical clips adjacent the cecum.  There is a left abdominal wall colostomy. Rectal pouch.  Abdominal aorta normal caliber.  No retroperitoneal periportal lymphadenopathy.  Review bone windows demonstrates no aggressive osseous lesions.  IMPRESSION:  1.  No evidence of disease progression in the pelvis. 2.  Stable enhancing mass within the medial left kidney representing either metastasis or more likely renal cell carcinoma. 3.  Left lower quadrant colostomy without evidence of complication.  Original Report Authenticated By: Genevive Bi, M.D.      ASSESSMENT/PLAN: This is a very pleasant 61 years old white male with extensive stage small cell lung cancer currently undergoing systemic chemotherapy with carboplatin and etoposide status post 3 cycles. The patient has  significant  improvement in his disease in the chest except for slightly enlarged the right upper lobe pulmonary nodule. The patient was discussed with Dr. Darrold Span. A right upper extremity Doppler was ordered and the pulmonary result revealed a substantial right subclavian deep vein thrombosis. Patient was educated and placed on subcutaneous Lovenox at 100 mg daily as instructed in addition to Coumadin 5 mg by mouth daily as followed by an adjusted by the [facility]. Patient voiced understanding as stated above he had a remote history of prior therapy with both Lovenox and Coumadin related to pulmonary emboli. He will followup as previously scheduled regarding his next cycle of chemotherapy. He'll followup with the Coumadin clinic in approximately 5 days.   Laural Benes, Anthony Jaso E, PA-C   All questions were answered. The patient knows to call the clinic with any problems, questions or concerns. We can certainly see the patient much sooner if necessary.  I spent 20 minutes counseling the patient face to face. The total time spent in the appointment was 30 minutes.

## 2012-08-26 NOTE — Telephone Encounter (Addendum)
Wife not at home but asked me to call pt because he is having some problems with pain . I called pt and he received Neulasta yesterday and last night he began experiencing sensitivity to touch on his side , back and arms. He describes it as  sore when he touches his skin in these areas. He admits to his muscles feeling sore-otherwise he states he feel okay. I instructed pt to take vicodin and to call if pain worsens. He voices understanding.

## 2012-08-29 ENCOUNTER — Ambulatory Visit (HOSPITAL_BASED_OUTPATIENT_CLINIC_OR_DEPARTMENT_OTHER): Payer: Medicare Other | Admitting: Pharmacist

## 2012-08-29 ENCOUNTER — Other Ambulatory Visit (HOSPITAL_BASED_OUTPATIENT_CLINIC_OR_DEPARTMENT_OTHER): Payer: Medicare Other

## 2012-08-29 DIAGNOSIS — Z86718 Personal history of other venous thrombosis and embolism: Secondary | ICD-10-CM

## 2012-08-29 DIAGNOSIS — I82B19 Acute embolism and thrombosis of unspecified subclavian vein: Secondary | ICD-10-CM

## 2012-08-29 DIAGNOSIS — I82409 Acute embolism and thrombosis of unspecified deep veins of unspecified lower extremity: Secondary | ICD-10-CM

## 2012-08-29 DIAGNOSIS — Z7901 Long term (current) use of anticoagulants: Secondary | ICD-10-CM

## 2012-08-29 DIAGNOSIS — C3410 Malignant neoplasm of upper lobe, unspecified bronchus or lung: Secondary | ICD-10-CM

## 2012-08-29 DIAGNOSIS — C3490 Malignant neoplasm of unspecified part of unspecified bronchus or lung: Secondary | ICD-10-CM

## 2012-08-29 LAB — CBC WITH DIFFERENTIAL/PLATELET
Basophils Absolute: 0 10*3/uL (ref 0.0–0.1)
Eosinophils Absolute: 0 10*3/uL (ref 0.0–0.5)
HGB: 10.3 g/dL — ABNORMAL LOW (ref 13.0–17.1)
MONO#: 0.2 10*3/uL (ref 0.1–0.9)
NEUT#: 8.9 10*3/uL — ABNORMAL HIGH (ref 1.5–6.5)
Platelets: 279 10*3/uL (ref 140–400)
RBC: 3.29 10*6/uL — ABNORMAL LOW (ref 4.20–5.82)
RDW: 19 % — ABNORMAL HIGH (ref 11.0–14.6)
WBC: 10.3 10*3/uL (ref 4.0–10.3)

## 2012-08-29 LAB — COMPREHENSIVE METABOLIC PANEL (CC13)
ALT: 12 U/L (ref 0–55)
AST: 10 U/L (ref 5–34)
Albumin: 3.7 g/dL (ref 3.5–5.0)
Alkaline Phosphatase: 183 U/L — ABNORMAL HIGH (ref 40–150)
Potassium: 4.1 mEq/L (ref 3.5–5.1)
Sodium: 139 mEq/L (ref 136–145)
Total Protein: 7 g/dL (ref 6.4–8.3)

## 2012-08-29 LAB — PROTIME-INR
INR: 1.3 — ABNORMAL LOW (ref 2.00–3.50)
Protime: 15.6 Seconds — ABNORMAL HIGH (ref 10.6–13.4)

## 2012-08-29 NOTE — Patient Instructions (Addendum)
Restart Lovenox 100mg  SQ daily.

## 2012-08-29 NOTE — Progress Notes (Signed)
INR subtherapeutic today (1.3) after holding x 2 doses, then resuming at lower dose of 2.5mg  daily. No changes in medications.  No problems with bleeding or bruising.   Will have pt resume Lovenox 100mg  SQ daily, and increase Coumadin dose to 2.5mg  daily except 5mg  on MWF. Recheck INR in 1 week with existing lab appt.

## 2012-09-01 ENCOUNTER — Other Ambulatory Visit: Payer: Self-pay | Admitting: Physician Assistant

## 2012-09-02 ENCOUNTER — Telehealth: Payer: Self-pay | Admitting: *Deleted

## 2012-09-02 ENCOUNTER — Encounter (INDEPENDENT_AMBULATORY_CARE_PROVIDER_SITE_OTHER): Payer: Medicare Other

## 2012-09-02 NOTE — Telephone Encounter (Signed)
Pt's wife called stating that he has fever of 101.4, chills, vomited x 1, and cannot get out of bed because he is so fatigued.  Per dr Donnald Garre, pt needs to go to the ED to be evaluated.  Called and spoke to pt's wife, she verbalized understanding.  SLJ

## 2012-09-05 ENCOUNTER — Ambulatory Visit (HOSPITAL_BASED_OUTPATIENT_CLINIC_OR_DEPARTMENT_OTHER): Payer: Medicare Other | Admitting: Pharmacist

## 2012-09-05 ENCOUNTER — Ambulatory Visit (INDEPENDENT_AMBULATORY_CARE_PROVIDER_SITE_OTHER): Payer: Medicare Other

## 2012-09-05 ENCOUNTER — Other Ambulatory Visit (HOSPITAL_BASED_OUTPATIENT_CLINIC_OR_DEPARTMENT_OTHER): Payer: Medicare Other

## 2012-09-05 VITALS — BP 128/74 | HR 82 | Temp 96.9°F | Resp 18 | Wt 137.2 lb

## 2012-09-05 DIAGNOSIS — Z4802 Encounter for removal of sutures: Secondary | ICD-10-CM

## 2012-09-05 DIAGNOSIS — C3490 Malignant neoplasm of unspecified part of unspecified bronchus or lung: Secondary | ICD-10-CM

## 2012-09-05 DIAGNOSIS — I82409 Acute embolism and thrombosis of unspecified deep veins of unspecified lower extremity: Secondary | ICD-10-CM

## 2012-09-05 DIAGNOSIS — Z86718 Personal history of other venous thrombosis and embolism: Secondary | ICD-10-CM

## 2012-09-05 LAB — CBC WITH DIFFERENTIAL/PLATELET
Basophils Absolute: 0 10*3/uL (ref 0.0–0.1)
EOS%: 0.3 % (ref 0.0–7.0)
MCH: 31.6 pg (ref 27.2–33.4)
MCHC: 33.3 g/dL (ref 32.0–36.0)
MCV: 95 fL (ref 79.3–98.0)
MONO%: 7 % (ref 0.0–14.0)
RBC: 2.95 10*6/uL — ABNORMAL LOW (ref 4.20–5.82)
RDW: 19.2 % — ABNORMAL HIGH (ref 11.0–14.6)

## 2012-09-05 LAB — COMPREHENSIVE METABOLIC PANEL (CC13)
AST: 10 U/L (ref 5–34)
BUN: 15 mg/dL (ref 7.0–26.0)
Calcium: 8.8 mg/dL (ref 8.4–10.4)
Chloride: 104 mEq/L (ref 98–107)
Creatinine: 1.2 mg/dL (ref 0.7–1.3)

## 2012-09-05 LAB — PROTIME-INR
INR: 4.8 — ABNORMAL HIGH (ref 2.00–3.50)
Protime: 57.6 Seconds — ABNORMAL HIGH (ref 10.6–13.4)

## 2012-09-05 NOTE — Progress Notes (Signed)
INR above goal. Pltc = 53. Hold Coumadin today and tomorrow.  Resume Coumadin on 09/07/12. Will decrease to Coumadin 2.5mg  daily. Recheck INR on Monday, 09/12/12 at 9:15am for lab, 9:45am Dr. Arbutus Ped appt, 10:45am for treatment and 11am for Coumadin Clinic.

## 2012-09-05 NOTE — Patient Instructions (Addendum)
Hold Coumadin today and tomorrow.  On  9/25, resume coumadin at decreased dose of 2.5mg  (1/2 tablet) daily.  Recheck INR on Monday, 09/12/12 at 9:15am for lab, 9:45am Dr. Arbutus Ped appt, 10:45am for treatment and 11am for Coumadin Clinic. The pharmacist will see you in the infusion area.

## 2012-09-05 NOTE — Progress Notes (Signed)
Patient seen in office today for suture removal from [facility] removal.  Removed sutures from incision on upper right side of chest, incision intact, steri-strips placed.  Patient tolerated well. Patient will follow up as needed.

## 2012-09-09 ENCOUNTER — Ambulatory Visit (HOSPITAL_COMMUNITY)
Admission: RE | Admit: 2012-09-09 | Discharge: 2012-09-09 | Disposition: A | Discharge status: Home / Self Care | Payer: Medicare Other | Source: Ambulatory Visit | Attending: Physician Assistant | Admitting: Physician Assistant

## 2012-09-09 DIAGNOSIS — J438 Other emphysema: Secondary | ICD-10-CM | POA: Insufficient documentation

## 2012-09-09 DIAGNOSIS — C3490 Malignant neoplasm of unspecified part of unspecified bronchus or lung: Secondary | ICD-10-CM | POA: Insufficient documentation

## 2012-09-09 MED ORDER — IOHEXOL 300 MG/ML  SOLN
100.0000 mL | Freq: Once | INTRAMUSCULAR | Status: AC | PRN
  Administered 2012-09-09: 100 mL via INTRAVENOUS

## 2012-09-12 ENCOUNTER — Other Ambulatory Visit (HOSPITAL_BASED_OUTPATIENT_CLINIC_OR_DEPARTMENT_OTHER): Payer: Medicare Other

## 2012-09-12 ENCOUNTER — Encounter: Payer: Self-pay | Admitting: *Deleted

## 2012-09-12 ENCOUNTER — Ambulatory Visit: Payer: Medicare Other | Admitting: Pharmacist

## 2012-09-12 ENCOUNTER — Telehealth: Payer: Self-pay | Admitting: Internal Medicine

## 2012-09-12 ENCOUNTER — Ambulatory Visit (HOSPITAL_BASED_OUTPATIENT_CLINIC_OR_DEPARTMENT_OTHER): Payer: Medicare Other | Admitting: Internal Medicine

## 2012-09-12 ENCOUNTER — Ambulatory Visit: Payer: Medicare Other

## 2012-09-12 VITALS — BP 119/65 | HR 100 | Temp 97.3°F | Resp 18 | Ht 70.5 in | Wt 145.0 lb

## 2012-09-12 DIAGNOSIS — C3490 Malignant neoplasm of unspecified part of unspecified bronchus or lung: Secondary | ICD-10-CM

## 2012-09-12 DIAGNOSIS — Z7901 Long term (current) use of anticoagulants: Secondary | ICD-10-CM

## 2012-09-12 DIAGNOSIS — C7A1 Malignant poorly differentiated neuroendocrine tumors: Secondary | ICD-10-CM

## 2012-09-12 DIAGNOSIS — N289 Disorder of kidney and ureter, unspecified: Secondary | ICD-10-CM

## 2012-09-12 DIAGNOSIS — Z86718 Personal history of other venous thrombosis and embolism: Secondary | ICD-10-CM

## 2012-09-12 DIAGNOSIS — I82629 Acute embolism and thrombosis of deep veins of unspecified upper extremity: Secondary | ICD-10-CM

## 2012-09-12 LAB — CBC WITH DIFFERENTIAL/PLATELET
Basophils Absolute: 0.1 10*3/uL (ref 0.0–0.1)
Eosinophils Absolute: 0.1 10*3/uL (ref 0.0–0.5)
HGB: 9 g/dL — ABNORMAL LOW (ref 13.0–17.1)
MCV: 96.9 fL (ref 79.3–98.0)
MONO%: 9.7 % (ref 0.0–14.0)
NEUT#: 11 10*3/uL — ABNORMAL HIGH (ref 1.5–6.5)
RDW: 21.2 % — ABNORMAL HIGH (ref 11.0–14.6)
lymph#: 2.2 10*3/uL (ref 0.9–3.3)

## 2012-09-12 LAB — PROTIME-INR
INR: 1.5 — ABNORMAL LOW (ref 2.00–3.50)
Protime: 18 Seconds — ABNORMAL HIGH (ref 10.6–13.4)

## 2012-09-12 LAB — POCT INR: INR: 1.5

## 2012-09-12 NOTE — Progress Notes (Signed)
Spoke with pt and wife at [facility] today.  Questions and concerns addressed 

## 2012-09-12 NOTE — Telephone Encounter (Signed)
Gave pt appt for October 2013 with Dr. Kathrynn Running and Coumadin clinic on October 7th with lab

## 2012-09-12 NOTE — Progress Notes (Signed)
[facility] Telephone:([phone]   Fax:([phone]  OFFICE PROGRESS NOTE  Lillia Mountain, MD [address], [address] [employer], [place]. [place] [address]  DIAGNOSIS:  1. Extensive stage small cell lung cancer diagnosed in June of 2013 2. Right upper extremity deep vein thrombosis diagnosed 08/19/2012  PRIOR THERAPY: Status post 4 cycles of systemic chemotherapy with carboplatin for AUC of 5 given on day 1 and etoposide 100 mg/M2 given on days 1, 2 and 3 with Neulasta support on day 4 last cycle of chemotherapy was given on 08/22/2012 with partial response.  CURRENT THERAPY: Coumadin at 5 mg by mouth daily or as directed by the [facility] Coumadin clinic currently with Lovenox 100 mg subcutaneous injection daily coverage  INTERVAL HISTORY: Anthony [name] 61 y.o. male returns to the clinic today for followup visit accompanied by his wife. The patient is doing fine today with no specific complaints. He tolerated the last cycle of his systemic chemotherapy with carboplatin and etoposide fairly well except for mild fatigue. He denied having any significant chest pain, shortness breath, cough or hemoptysis. Has no significant weight loss or night sweats. The patient has repeat CT scan of the chest, abdomen and pelvis performed recently and he is here for evaluation and discussion of his scan results.  MEDICAL HISTORY: Past Medical History  Diagnosis Date  . Crohn's disease of colon   . Pulmonary emboli   . COPD (chronic obstructive pulmonary disease)   . Emphysema   . Seizure disorder   . Kidney mass   . Seizures   . Arthritis   . Emphysema of lung   . Weight loss, unintentional   . Cough   . Chest pain   . Weakness   . Cancer     lung     ALLERGIES:   has no known allergies.  MEDICATIONS:  Current Outpatient Prescriptions  Medication Sig Dispense Refill  . albuterol (PROVENTIL HFA;VENTOLIN HFA) 108 (90  BASE) MCG/ACT inhaler Inhale 2 puffs into the lungs every 6 (six) hours as needed. For shortness of breath.      . budesonide-formoterol (SYMBICORT) 160-4.5 MCG/ACT inhaler Inhale 2 puffs into the lungs 2 (two) times daily.      . Calcium Carbonate-Vitamin D (CALCIUM 600 + D PO) Take 1 tablet by mouth daily.      . cyanocobalamin (,VITAMIN B-12,) 1000 MCG/ML injection Inject into the muscle every 30 (thirty) days. Administers on the 15th of each month.      . Hydrocodone-Acetaminophen (VICODIN HP) 10-300 MG TABS Take 1-2 tablets by mouth daily as needed. For pain.      Marland Kitchen LORazepam (ATIVAN) 1 MG tablet Take 1 mg by mouth 3 (three) times daily.      . phenytoin (DILANTIN) 100 MG ER capsule Take 200 mg by mouth 2 (two) times daily.      . phenytoin (DILANTIN) 30 MG ER capsule Take 60 mg by mouth 2 (two) times daily.      . prochlorperazine (COMPAZINE) 10 MG tablet Take 10 mg by mouth every 6 (six) hours as needed. For chemo-related nausea.      . promethazine (PHENERGAN) 25 MG tablet Take 25 mg by mouth daily as needed. For nausea.      Marland Kitchen tiotropium (SPIRIVA) 18 MCG inhalation capsule Place 18 mcg into inhaler and inhale daily.      Marland Kitchen lidocaine-prilocaine (EMLA) cream Apply topically as needed. Apply  to port 1hr before chemo  30 g  0  . warfarin (COUMADIN) 5 MG tablet Take 1 tablet by mouth daily between 4:00 and 6:00 pm, or as directed  50 tablet  0    SURGICAL HISTORY:  Past Surgical History  Procedure Date  . Arm surgery     left  . Right knee   . Rectal surgery     x7  . Abdominal surgery     x3  . Video bronchoscopy 06/09/2012    Procedure: VIDEO BRONCHOSCOPY WITHOUT FLUORO;  Surgeon: Leslye Peer, MD;  Location: [facility];  Service: Cardiopulmonary;  Laterality: Bilateral;  . Portacath placement 07/05/2012    Procedure: INSERTION PORT-A-CATH;  Surgeon: Mariella Saa, MD;  Location: [facility] OR;  Service: General;  Laterality: N/A;  . Port-a-cath removal 08/24/2012    Procedure:  MINOR REMOVAL PORT-A-CATH;  Surgeon: Mariella Saa, MD;  Location: [facility];  Service: General;  Laterality: Right;    REVIEW OF SYSTEMS:  A comprehensive review of systems was negative except for: Constitutional: positive for fatigue   PHYSICAL EXAMINATION: General appearance: alert, cooperative and no distress Head: Normocephalic, without obvious abnormality, atraumatic Neck: no adenopathy Lymph nodes: Cervical, supraclavicular, and axillary nodes normal. Resp: clear to auscultation bilaterally Cardio: regular rate and rhythm, S1, S2 normal, no murmur, click, rub or gallop GI: soft, non-tender; bowel sounds normal; no masses,  no organomegaly Extremities: extremities normal, atraumatic, no cyanosis or edema Neurologic: Alert and oriented X 3, normal strength and tone. Normal symmetric reflexes. Normal coordination and gait  ECOG PERFORMANCE STATUS: 1 - Symptomatic but completely ambulatory  Blood pressure 119/65, pulse 100, temperature 97.3 F (36.3 C), temperature source Oral, resp. rate 18, height 5' 10.5" (1.791 m), weight 145 lb (65.772 kg).  LABORATORY DATA: Lab Results  Component Value Date   WBC 14.8* 09/12/2012   HGB 9.0* 09/12/2012   HCT 27.7* 09/12/2012   MCV 96.9 09/12/2012   PLT 252 09/12/2012      Chemistry      Component Value Date/Time   NA 140 09/05/2012 1211   NA 142 08/01/2012 1205   K 4.1 09/05/2012 1211   K 4.1 08/01/2012 1205   CL 104 09/05/2012 1211   CL 103 08/01/2012 1205   CO2 24 09/05/2012 1211   CO2 26 08/01/2012 1205   BUN 15.0 09/05/2012 1211   BUN 15 08/01/2012 1205   CREATININE 1.2 09/05/2012 1211   CREATININE 1.22 08/01/2012 1205      Component Value Date/Time   CALCIUM 8.8 09/05/2012 1211   CALCIUM 7.7* 08/01/2012 1205   ALKPHOS 130 09/05/2012 1211   ALKPHOS 131* 08/01/2012 1205   AST 10 09/05/2012 1211   AST 13 08/01/2012 1205   ALT 9 09/05/2012 1211   ALT 10 08/01/2012 1205   BILITOT 0.20 09/05/2012 1211   BILITOT 0.2*  08/01/2012 1205       RADIOGRAPHIC STUDIES: Ct Chest W Contrast  09/09/2012  *RADIOLOGY REPORT*  Clinical Data:  Lung cancer.  Restaging.  CT CHEST, ABDOMEN AND PELVIS WITH CONTRAST  Technique:  Multidetector CT imaging of the chest, abdomen and pelvis was performed following the standard protocol during bolus administration of intravenous contrast.  Contrast: OMNIPAQUE IOHEXOL 300 MG/ML  SOLN  Comparison:  07/29/2012   CT CHEST  Findings:  No supraclavicular or axillary adenopathy.  The index subcarinal lymph node measures 0.8 cm, image 31.  Previously this measured 0.9 cm.  Right hilar lymph node  measures 1.1 cm, image 31. Previously 1.2 cm.  Within the perihilar right lung there is a mass measuring 3.5 x 1.9 cm, image 30.  Previously this measured 3.7 x 2.3 cm. Index nodule within the right upper lobe measures 0.8 cm, image 20.  Previously 1.1 cm.  Changes of emphysema are noted in both lungs.  Review of the visualized bony structures is unremarkable.  No worrisome lytic or sclerotic bone lesions identified.  IMPRESSION:  1.  No acute findings. 2.  Index mass within the perihilar right lung is slightly decreased in size from previous exam.  The index nodule in the right upper lobe is decreased from previous study. 3.  No new or progressive disease identified.   CT ABDOMEN AND PELVIS  Findings:  The spleen appears within normal limits.  There are no focal liver abnormalities.  The pancreas appears within normal limits.  Normal appearance of the spleen.  Both adrenal glands are normal.  Multiple bilateral renal cysts are identified.  Enhancing lesion within the upper pole of the left kidney is again noted measuring 30 x 2.8 cm, image 73.  This is compared with the same previously.  Urinary bladder appears normal.  The prostate gland is enlarged with a transverse diameter of 4.6 cm, image 119.  No enlarged upper abdominal or pelvic lymph nodes.  No inguinal adenopathy.  The stomach appears normal.  The  patient has a left lower quadrant colostomy.  A Hartmann's pouch is identified.  There is no ascites identified within the abdomen or pelvis.  No evidence for peritoneal disease.  Review of the visualized bony structures shows advanced osteoarthritis involving the right hip.  IMPRESSION:  1.  No acute findings within the abdomen or pelvis. 2.  No change and mass within the upper pole of the left kidney which may represent a renal cell carcinoma or focus of metastasis.   Original Report Authenticated By: Rosealee Albee, M.D.       ASSESSMENT: This is a very pleasant 61 years old white male with extensive stage small cell lung cancer status post 4 cycles of systemic chemotherapy with carboplatin and etoposide with partial response.  PLAN: I discussed the scan results and showed the images to the patient and his wife. I recommended for him to see in addition oncology for consideration of radiotherapy to the residual disease in his chest in addition to prophylactic cranial irradiation. The patient continues to have the left kidney mass concerning for renal cell carcinoma or focus of metastasis. It has been stable since the previous scan. I would consider referring him to urology for evaluation. I would see him back for followup visit in 3 months with repeat CT scan of the chest, abdomen and pelvis for restaging of his disease. He was advised to call me immediately she has any concerning symptoms in the interval.   All questions were answered. The patient knows to call the clinic with any problems, questions or concerns. We can certainly see the patient much sooner if necessary.  I spent 20 minutes counseling the patient face to face. The total time spent in the appointment was 30 minutes.

## 2012-09-12 NOTE — Progress Notes (Signed)
Unsure why INR below goal after increased to 4.8.  Pt has been taking 2.5mg  daily since 9/25 after holding for 2 days.  Will increase Coumadin dose to 5mg  MWF and 2.5mg  other days and check PT/INR in 1 week.

## 2012-09-12 NOTE — Telephone Encounter (Signed)
Talked to Dr. Arbutus Ped, he wants chemo and lab be cancelled for October 2013, gave pt appt for January 2014 lab and MD, referred to radiation oncology

## 2012-09-12 NOTE — Patient Instructions (Signed)
You continues to have improvement in the lung mass. The kidney mass is still stable and suspicious for kidney cancer. I will refer you to radiation oncology for consideration of palliative radiation to the residual tumor in your lung as well as prophylactic brain radiation. Followup in 3 months with repeat CT scan of the chest abdomen and pelvis. Referral to urology for evaluation of the kidney mass

## 2012-09-13 ENCOUNTER — Ambulatory Visit: Payer: Medicare Other

## 2012-09-13 ENCOUNTER — Telehealth: Payer: Self-pay | Admitting: Internal Medicine

## 2012-09-13 ENCOUNTER — Encounter: Payer: Medicare Other | Admitting: Nutrition

## 2012-09-13 NOTE — Telephone Encounter (Signed)
appt made with [facility]-dr borden 09/23/12 at 1:45 and the pt is aware      aom

## 2012-09-14 ENCOUNTER — Ambulatory Visit: Payer: Medicare Other

## 2012-09-15 ENCOUNTER — Ambulatory Visit: Payer: Medicare Other

## 2012-09-16 ENCOUNTER — Encounter: Payer: Self-pay | Admitting: Radiation Oncology

## 2012-09-16 DIAGNOSIS — C801 Malignant (primary) neoplasm, unspecified: Secondary | ICD-10-CM | POA: Insufficient documentation

## 2012-09-19 ENCOUNTER — Other Ambulatory Visit: Payer: Medicare Other | Admitting: Lab

## 2012-09-19 ENCOUNTER — Other Ambulatory Visit (HOSPITAL_BASED_OUTPATIENT_CLINIC_OR_DEPARTMENT_OTHER): Payer: Medicare Other

## 2012-09-19 ENCOUNTER — Ambulatory Visit (HOSPITAL_BASED_OUTPATIENT_CLINIC_OR_DEPARTMENT_OTHER): Payer: Medicare Other | Admitting: Pharmacist

## 2012-09-19 ENCOUNTER — Encounter: Payer: Self-pay | Admitting: Radiation Oncology

## 2012-09-19 ENCOUNTER — Ambulatory Visit
Admission: RE | Admit: 2012-09-19 | Discharge: 2012-09-19 | Disposition: A | Discharge status: Home / Self Care | Payer: Medicare Other | Source: Ambulatory Visit | Attending: Radiation Oncology | Admitting: Radiation Oncology

## 2012-09-19 VITALS — BP 113/71 | HR 90 | Temp 97.9°F | Resp 20 | Ht 71.5 in | Wt 142.2 lb

## 2012-09-19 DIAGNOSIS — J438 Other emphysema: Secondary | ICD-10-CM

## 2012-09-19 DIAGNOSIS — Z510 Encounter for antineoplastic radiation therapy: Secondary | ICD-10-CM | POA: Insufficient documentation

## 2012-09-19 DIAGNOSIS — Z86718 Personal history of other venous thrombosis and embolism: Secondary | ICD-10-CM

## 2012-09-19 DIAGNOSIS — C3490 Malignant neoplasm of unspecified part of unspecified bronchus or lung: Secondary | ICD-10-CM | POA: Insufficient documentation

## 2012-09-19 DIAGNOSIS — I82409 Acute embolism and thrombosis of unspecified deep veins of unspecified lower extremity: Secondary | ICD-10-CM

## 2012-09-19 DIAGNOSIS — R0609 Other forms of dyspnea: Secondary | ICD-10-CM | POA: Insufficient documentation

## 2012-09-19 DIAGNOSIS — R569 Unspecified convulsions: Secondary | ICD-10-CM | POA: Insufficient documentation

## 2012-09-19 DIAGNOSIS — F17200 Nicotine dependence, unspecified, uncomplicated: Secondary | ICD-10-CM | POA: Insufficient documentation

## 2012-09-19 DIAGNOSIS — Z86711 Personal history of pulmonary embolism: Secondary | ICD-10-CM | POA: Insufficient documentation

## 2012-09-19 DIAGNOSIS — R0989 Other specified symptoms and signs involving the circulatory and respiratory systems: Secondary | ICD-10-CM | POA: Insufficient documentation

## 2012-09-19 DIAGNOSIS — Z79899 Other long term (current) drug therapy: Secondary | ICD-10-CM | POA: Insufficient documentation

## 2012-09-19 DIAGNOSIS — C801 Malignant (primary) neoplasm, unspecified: Secondary | ICD-10-CM

## 2012-09-19 DIAGNOSIS — J209 Acute bronchitis, unspecified: Secondary | ICD-10-CM

## 2012-09-19 HISTORY — DX: Malignant neoplasm of unspecified part of unspecified bronchus or lung: C34.90

## 2012-09-19 LAB — CBC WITH DIFFERENTIAL/PLATELET
Basophils Absolute: 0.1 10*3/uL (ref 0.0–0.1)
EOS%: 0.6 % (ref 0.0–7.0)
Eosinophils Absolute: 0.1 10*3/uL (ref 0.0–0.5)
HCT: 30 % — ABNORMAL LOW (ref 38.4–49.9)
HGB: 9.9 g/dL — ABNORMAL LOW (ref 13.0–17.1)
LYMPH%: 23.8 % (ref 14.0–49.0)
MCH: 32.2 pg (ref 27.2–33.4)
MCV: 97.1 fL (ref 79.3–98.0)
MONO%: 13.8 % (ref 0.0–14.0)
NEUT#: 4.9 10*3/uL (ref 1.5–6.5)
NEUT%: 60.7 % (ref 39.0–75.0)
Platelets: 458 10*3/uL — ABNORMAL HIGH (ref 140–400)
RDW: 21.8 % — ABNORMAL HIGH (ref 11.0–14.6)
WBC: 8.1 10*3/uL (ref 4.0–10.3)

## 2012-09-19 LAB — COMPREHENSIVE METABOLIC PANEL (CC13)
AST: 15 U/L (ref 5–34)
Albumin: 3.7 g/dL (ref 3.5–5.0)
BUN: 18 mg/dL (ref 7.0–26.0)
CO2: 22 mEq/L (ref 22–29)
Calcium: 8.8 mg/dL (ref 8.4–10.4)
Chloride: 103 mEq/L (ref 98–107)
Potassium: 4.7 mEq/L (ref 3.5–5.1)

## 2012-09-19 LAB — PROTIME-INR: INR: 1.4 — ABNORMAL LOW (ref 2.00–3.50)

## 2012-09-19 LAB — POCT INR: INR: 1.4

## 2012-09-19 NOTE — Addendum Note (Signed)
Encounter addended by: Glennie Hawk, RN on: 09/19/2012  4:49 PM<BR>     Documentation filed: Charges VN

## 2012-09-19 NOTE — Patient Instructions (Signed)
   Department of Radiation Oncology  Phone:  [phone] Fax:        [phone]   What to Expect on Simulation Day  "Simulation" is the planning day. The goal for this day is to get everything set up to start your radiation treatments.  You will be getting a CT scan.  Here is a picture of the machine so we can better prepare you for what to expect during your visit: .   You may have had previous scans but this scan is different.  The doctor will use this scan to plan your treatments.   You will be with us for approximately 1 hour.  Upon arrival, register and get a pager; once the pager goes off, take the elevator to the ground level.  Someone will meet you and walk you to the CT simulator.  You will need to change into a gown.  You may also need to remove any jewelry.    We will be asking your name and birthday to verify your identity and ensure your safety.  We apologize in advance that the table that you will be laying on for your CT scan will be a hard surface.  This table surface is necessary to ensure the best treatment possible.    You will be lying on the table in the position that you will be treated in daily. This includes using different items such as molds or arm holders that will keep you in the same position every day.   It is very important while you are on the table to try to relax and hold as still as possible, during and after the CT scan.  Some scans require contrast.  Contrast is a tool that we use to highlight different areas that the doctor may want to see better on the scan.  We will let you know if you will need contrast before your simulation appointment.    After your scan while you are still on the table, the doctor will determine where to place tattoos on your skin. Tattoos are permanent marks that we use to ensure that you are in the right position for your treatment. We place the tattoos by using tattoo ink and a needle to make a small stick just  underneath your skin. They are very small and look like freckles.  You will only receive these on simulation day, not every day.   We will be taking photographs of the tattoos and your position on the table for documentation. These photos are in your chart and are only used by your radiation oncology team.    We will give you your daily schedule for your radiation treatments along with an explanation of what to expect on treatment day.   Thank you for allowing us to be a part of your care!  Please let us know if you have any questions! [facility]:  [phone]  

## 2012-09-19 NOTE — Progress Notes (Addendum)
Pt c/o "mild pain in his right chest". Pt takes Hydrocodone 1-2 tabs daily, "never more than 2" with 75% pain relief. He states occasionally he gets a "stabbing pain in his right chest that goes away quickly". Pt has chronic right hip pain which makes it difficult for him to stand up straight. Pt c/o mild discomfort of ventral side upper right arm. 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  Pt has rare dry cough, SOB w/minimal activity. Pt c/o mild pain on Denies loss of appetite, is fatigued.

## 2012-09-19 NOTE — Progress Notes (Addendum)
INR unchanged since increasing dose.  Will increase to Coumadin 5mg  daily.  Pt has appt in [place] on Friday, so will schedule pt to return to [facility] on Friday.  Pt lives 60 minutes away.   Will not add Lovenox at this time per discussion with Dr. Arbutus Ped.

## 2012-09-19 NOTE — Progress Notes (Signed)
Radiation Oncology         [phone] ________________________________  Initial outpatient Consultation  Name: Anthony [name] MRN: [id number]  Date: 09/19/2012  DOB: Jul 10, 1951  GU:[id number],QVZD [name], MD  Si Gaul, MD   REFERRING PHYSICIAN: Si Gaul, MD  DIAGNOSIS: 61 years old white male with extensive stage small cell lung cancer status post 4 cycles of systemic chemotherapy with carboplatin and etoposide with partial response  HISTORY OF PRESENT ILLNESS::Anthony [name] is a 61 y.o. male who in June, started complaining of increasing fatigue and weakness as well as fever and nausea and vomiting and pain in the right side of his chest. He was seen by his primary care physician Dr. Kirby Funk who ordered chest x-ray and it showed right lung mass. This was followed by CT scan of the chest, abdomen and pelvis on 06/02/2012 and it showed a large right hilar mass of 7.1 x 4.8 x 5.3 cm consistent with primary lung carcinoma. Also, there are poorly defined nodular opacities in the right upper lobe suspicious for metastases of 7 mm, 9 mm, and 5 mm in diameter. Mediastinal nodes none of which are pathologically enlarged, but they are suspicious for metastatic involvement in view of the right hilar mass.3.0 cm medial left upper renal mass consistent with either renal cell carcinoma or metastatic lesion. indeterminate 9 mm lesion in the upper anterior left kidney may represent complex cyst or primary versus metastatic lesion. No metastatic involvement of the liver is seen. No adenopathy is noted.  The patient was referred to Dr. Delton Coombes and on 06/09/2012 he underwent a video bronchoscopy with biopsy of the right hilar mass. 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 Dr. Delton Coombes referred the patient to [facility] and received 4 cycles of carboplatin and etopiside with partial response.  He has kindly been referred today to discuss PCI and  chest consolidative radiotherapy.    The patient also has a 3 cm left renal mass worrisome for primary renal cell carcinoma.  He'll see Dr. Laverle Patter later this week to discuss that.  PREVIOUS RADIATION THERAPY: No  PAST MEDICAL HISTORY:  has a past medical history of Crohn's disease of colon; Pulmonary emboli; COPD (chronic obstructive pulmonary disease); Emphysema; Seizure disorder; Kidney mass; Seizures; Arthritis; Emphysema of lung; Weight loss, unintentional; Cough; Chest pain; Weakness; and Cancer (06/09/12).    PAST SURGICAL HISTORY: Past Surgical History  Procedure Date  . Arm surgery     left  . Right knee   . Rectal surgery     x7  . Abdominal surgery     x3  . Video bronchoscopy 06/09/2012    Procedure: VIDEO BRONCHOSCOPY WITHOUT FLUORO;  Surgeon: Leslye Peer, MD;  Location: [facility];  Service: Cardiopulmonary;  Laterality: Bilateral;  . Portacath placement 07/05/2012    Procedure: INSERTION PORT-A-CATH;  Surgeon: Mariella Saa, MD;  Location: [facility];  Service: General;  Laterality: N/A;  . Port-a-cath removal 08/24/2012    Procedure: MINOR REMOVAL PORT-A-CATH;  Surgeon: Mariella Saa, MD;  Location: [facility];  Service: General;  Laterality: Right;    FAMILY HISTORY: family history includes Cancer in his brother; Diabetes in his brother and sisters; Emphysema in his brothers; Heart disease in his brothers, father, and mother; Hypertension in his brother; and Liver cancer in his brother.  SOCIAL HISTORY:  reports that he has been smoking Cigarettes.  He has a 52 pack-year smoking history. He has never used smokeless  tobacco. He reports that he does not drink alcohol or use illicit drugs.  ALLERGIES: Review of patient's allergies indicates no known allergies.  MEDICATIONS:  Current Outpatient Prescriptions  Medication Sig Dispense Refill  . albuterol (PROVENTIL HFA;VENTOLIN HFA) 108 (90 BASE) MCG/ACT inhaler Inhale 2 puffs into the lungs every 6  (six) hours as needed. For shortness of breath.      . budesonide-formoterol (SYMBICORT) 160-4.5 MCG/ACT inhaler Inhale 2 puffs into the lungs 2 (two) times daily.      . Calcium Carbonate-Vitamin D (CALCIUM 600 + D PO) Take 1 tablet by mouth daily.      . cyanocobalamin (,VITAMIN B-12,) 1000 MCG/ML injection Inject into the muscle every 30 (thirty) days. Administers on the 15th of each month.      . Hydrocodone-Acetaminophen (VICODIN HP) 10-300 MG TABS Take 1-2 tablets by mouth daily as needed. For pain.      Marland Kitchen lidocaine-prilocaine (EMLA) cream Apply topically as needed. Apply to port 1hr before chemo  30 g  0  . LORazepam (ATIVAN) 1 MG tablet Take 1 mg by mouth 3 (three) times daily.      . phenytoin (DILANTIN) 100 MG ER capsule Take 200 mg by mouth 2 (two) times daily.      . phenytoin (DILANTIN) 30 MG ER capsule Take 60 mg by mouth 2 (two) times daily.      . prochlorperazine (COMPAZINE) 10 MG tablet Take 10 mg by mouth every 6 (six) hours as needed. For chemo-related nausea.      . promethazine (PHENERGAN) 25 MG tablet Take 25 mg by mouth daily as needed. For nausea.      Marland Kitchen tiotropium (SPIRIVA) 18 MCG inhalation capsule Place 18 mcg into inhaler and inhale daily.      Marland Kitchen warfarin (COUMADIN) 5 MG tablet Take 1 tablet by mouth daily between 4:00 and 6:00 pm, or as directed  50 tablet  0    REVIEW OF SYSTEMS:  A 15 point review of systems is documented in the electronic medical record. This was obtained by the nursing staff. However, I reviewed this with the patient to discuss relevant findings and make appropriate changes.  A comprehensive review of systems was negative.   PHYSICAL EXAM:  vitals were not taken for this visit.  The patient is a thin elderly gentleman who appears chronically ill with alopecia.  His respiratory effort is unlabored.  Lungs are clear with distant breath sounds.  He is neurologically intact.  LABORATORY DATA:  Lab Results  Component Value Date   WBC 8.1 09/19/2012     HGB 9.9* 09/19/2012   HCT 30.0* 09/19/2012   MCV 97.1 09/19/2012   PLT 458* 09/19/2012   Lab Results  Component Value Date   NA 136 09/19/2012   K 4.7 09/19/2012   CL 103 09/19/2012   CO2 22 09/19/2012   Lab Results  Component Value Date   ALT 10 09/19/2012   AST 15 09/19/2012   ALKPHOS 108 09/19/2012   BILITOT 0.20 09/19/2012     RADIOGRAPHY: Ct Chest W Contrast  09/09/2012  *RADIOLOGY REPORT*  Clinical Data:  Lung cancer.  Restaging.  CT CHEST, ABDOMEN AND PELVIS WITH CONTRAST  Technique:  Multidetector CT imaging of the chest, abdomen and pelvis was performed following the standard protocol during bolus administration of intravenous contrast.  Contrast: OMNIPAQUE IOHEXOL 300 MG/ML  SOLN  Comparison:  07/29/2012  CT CHEST  Findings:  No supraclavicular or axillary adenopathy.  The index subcarinal  lymph node measures 0.8 cm, image 31.  Previously this measured 0.9 cm.  Right hilar lymph node measures 1.1 cm, image 31. Previously 1.2 cm.  Within the perihilar right lung there is a mass measuring 3.5 x 1.9 cm, image 30.  Previously this measured 3.7 x 2.3 cm. Index nodule within the right upper lobe measures 0.8 cm, image 20.  Previously 1.1 cm.  Changes of emphysema are noted in both lungs.  Review of the visualized bony structures is unremarkable.  No worrisome lytic or sclerotic bone lesions identified.  IMPRESSION:  1.  No acute findings. 2.  Index mass within the perihilar right lung is slightly decreased in size from previous exam.  The index nodule in the right upper lobe is decreased from previous study. 3.  No new or progressive disease identified.  CT ABDOMEN AND PELVIS  Findings:  The spleen appears within normal limits.  There are no focal liver abnormalities.  The pancreas appears within normal limits.  Normal appearance of the spleen.  Both adrenal glands are normal.  Multiple bilateral renal cysts are identified.  Enhancing lesion within the upper pole of the left kidney is again  noted measuring 30 x 2.8 cm, image 73.  This is compared with the same previously.  Urinary bladder appears normal.  The prostate gland is enlarged with a transverse diameter of 4.6 cm, image 119.  No enlarged upper abdominal or pelvic lymph nodes.  No inguinal adenopathy.  The stomach appears normal.  The patient has a left lower quadrant colostomy.  A Hartmann's pouch is identified.  There is no ascites identified within the abdomen or pelvis.  No evidence for peritoneal disease.  Review of the visualized bony structures shows advanced osteoarthritis involving the right hip.  IMPRESSION:  1.  No acute findings within the abdomen or pelvis. 2.  No change and mass within the upper pole of the left kidney which may represent a renal cell carcinoma or focus of metastasis.   Original Report Authenticated By: Rosealee Albee, M.D.    Ct Abdomen Pelvis W Contrast  09/09/2012  *RADIOLOGY REPORT*  Clinical Data:  Lung cancer.  Restaging.  CT CHEST, ABDOMEN AND PELVIS WITH CONTRAST  Technique:  Multidetector CT imaging of the chest, abdomen and pelvis was performed following the standard protocol during bolus administration of intravenous contrast.  Contrast: OMNIPAQUE IOHEXOL 300 MG/ML  SOLN  Comparison:  07/29/2012  CT CHEST  Findings:  No supraclavicular or axillary adenopathy.  The index subcarinal lymph node measures 0.8 cm, image 31.  Previously this measured 0.9 cm.  Right hilar lymph node measures 1.1 cm, image 31. Previously 1.2 cm.  Within the perihilar right lung there is a mass measuring 3.5 x 1.9 cm, image 30.  Previously this measured 3.7 x 2.3 cm. Index nodule within the right upper lobe measures 0.8 cm, image 20.  Previously 1.1 cm.  Changes of emphysema are noted in both lungs.  Review of the visualized bony structures is unremarkable.  No worrisome lytic or sclerotic bone lesions identified.  IMPRESSION:  1.  No acute findings. 2.  Index mass within the perihilar right lung is slightly decreased  in size from previous exam.  The index nodule in the right upper lobe is decreased from previous study. 3.  No new or progressive disease identified.  CT ABDOMEN AND PELVIS  Findings:  The spleen appears within normal limits.  There are no focal liver abnormalities.  The pancreas appears within normal  limits.  Normal appearance of the spleen.  Both adrenal glands are normal.  Multiple bilateral renal cysts are identified.  Enhancing lesion within the upper pole of the left kidney is again noted measuring 30 x 2.8 cm, image 73.  This is compared with the same previously.  Urinary bladder appears normal.  The prostate gland is enlarged with a transverse diameter of 4.6 cm, image 119.  No enlarged upper abdominal or pelvic lymph nodes.  No inguinal adenopathy.  The stomach appears normal.  The patient has a left lower quadrant colostomy.  A Hartmann's pouch is identified.  There is no ascites identified within the abdomen or pelvis.  No evidence for peritoneal disease.  Review of the visualized bony structures shows advanced osteoarthritis involving the right hip.  IMPRESSION:  1.  No acute findings within the abdomen or pelvis. 2.  No change and mass within the upper pole of the left kidney which may represent a renal cell carcinoma or focus of metastasis.   Original Report Authenticated By: Rosealee Albee, M.D.       IMPRESSION: Mr. Thibert is a very nice 61 year old gentleman with extensive stage small cell lung cancer status post 4 cycles of systemic chemotherapy with carboplatin and etoposide with partial response.  At this point, he may benefit from prophylactic cranial irradiation to reduce his likelihood of devolping brain mets and increase his survival.  During a course of brain irradiation, his chest could be treated also to mitigate any risk of tumor growth during his break from chemo.  PLAN:  Today, I reviewed the findings and workup thus far with the patient. We discussed the dilemma regarding whole  brain radiotherapy for prophylaxis.  At this point, the patient would potentially benefit from radiotherapy. We discussed whole brain irradiation. Whole brain radiotherapy would help provide some reduction of risk for future brain metastases. However, whole brain radiotherapy carries potential risks including hair loss, subacute somnolence, and neurocognitive changes including a possible reduction in short-term memory.  We discussed the pros and cons of each. We also discussed the logistics and delivery of each. We reviewed the results associated with each of the treatments described above. The patient seems to understand the treatment options and would like to proceed with PCI.  We also spent time discussing thoracic radiation goals, delivery, and effects.  He would like to receive chest radiation during his brain treatment.  I tentatively arranged CT simulation on 10/18, pending his plans with Dr. Laverle Patter.  At the time of his CT sim, I will include IV contrast to help rule out gross brain metastases before prophylactic treatment.  I spent 60 minutes minutes face to face with the patient and more than 50% of that time was spent in counseling and/or coordination of care.    ------------------------------------------------  Artist Pais. Kathrynn Running, M.D.

## 2012-09-19 NOTE — Progress Notes (Signed)
Please see the Nurse Progress Note in the MD Initial Consult Encounter for this patient. 

## 2012-09-20 ENCOUNTER — Encounter: Payer: Self-pay | Admitting: *Deleted

## 2012-09-23 ENCOUNTER — Ambulatory Visit (HOSPITAL_BASED_OUTPATIENT_CLINIC_OR_DEPARTMENT_OTHER): Payer: Medicare Other | Admitting: Pharmacist

## 2012-09-23 ENCOUNTER — Other Ambulatory Visit (HOSPITAL_BASED_OUTPATIENT_CLINIC_OR_DEPARTMENT_OTHER): Payer: Medicare Other

## 2012-09-23 DIAGNOSIS — I82629 Acute embolism and thrombosis of deep veins of unspecified upper extremity: Secondary | ICD-10-CM

## 2012-09-23 DIAGNOSIS — Z86718 Personal history of other venous thrombosis and embolism: Secondary | ICD-10-CM

## 2012-09-23 DIAGNOSIS — I82409 Acute embolism and thrombosis of unspecified deep veins of unspecified lower extremity: Secondary | ICD-10-CM

## 2012-09-23 LAB — PROTIME-INR: INR: 1.8 — ABNORMAL LOW (ref 2.00–3.50)

## 2012-09-23 NOTE — Addendum Note (Signed)
Encounter addended by: Sherill Mangen Mintz Janeal Abadi, RN on: 09/23/2012  6:30 PM<BR>     Documentation filed: Charges VN

## 2012-09-23 NOTE — Patient Instructions (Signed)
Take 7.5 mg today and tomorrow then resume 5 mg daily.  Check PT/INR on Friday on 09/30/12 at 9:00am.

## 2012-09-23 NOTE — Progress Notes (Signed)
Take 7.5 mg today and tomorrow then resume 5 mg daily.  Check PT/INR on Friday on 09/30/12 at 9:00am.  Pt is traveling 60 miles for each appmt, so will have next appmt coincide with radiation planning appmt on 09/30/12.

## 2012-09-26 ENCOUNTER — Other Ambulatory Visit: Payer: Medicare Other | Admitting: Lab

## 2012-09-27 ENCOUNTER — Telehealth: Payer: Self-pay | Admitting: *Deleted

## 2012-09-27 NOTE — Telephone Encounter (Signed)
Note from [facility] given to Dr MKM to review.  SLJ 

## 2012-09-30 ENCOUNTER — Other Ambulatory Visit (HOSPITAL_BASED_OUTPATIENT_CLINIC_OR_DEPARTMENT_OTHER): Payer: Medicare Other | Admitting: Lab

## 2012-09-30 ENCOUNTER — Ambulatory Visit
Admission: RE | Admit: 2012-09-30 | Discharge: 2012-09-30 | Disposition: A | Discharge status: Home / Self Care | Payer: Medicare Other | Source: Ambulatory Visit | Attending: Radiation Oncology | Admitting: Radiation Oncology

## 2012-09-30 ENCOUNTER — Ambulatory Visit: Payer: Medicare Other | Admitting: Pharmacist

## 2012-09-30 DIAGNOSIS — I82409 Acute embolism and thrombosis of unspecified deep veins of unspecified lower extremity: Secondary | ICD-10-CM

## 2012-09-30 DIAGNOSIS — C3490 Malignant neoplasm of unspecified part of unspecified bronchus or lung: Secondary | ICD-10-CM

## 2012-09-30 DIAGNOSIS — Z86718 Personal history of other venous thrombosis and embolism: Secondary | ICD-10-CM

## 2012-09-30 LAB — PROTIME-INR
INR: 3.2 (ref 2.00–3.50)
Protime: 38.4 Seconds — ABNORMAL HIGH (ref 10.6–13.4)

## 2012-09-30 NOTE — Progress Notes (Signed)
INR slightly supratherapeutic (3.2) after taking 7.5mg  Friday, Saturday, then 5mg  daily last week. No changes in meds or diet.  No problems with bleeding or bruising. Will decrease weekly dose slightly to 5mg  daily except 7.5mg  on Saturday.  Recheck INR in 1 week.

## 2012-09-30 NOTE — Progress Notes (Signed)
  Radiation Oncology         [phone] ________________________________  Name: Anthony [name] MRN: [id number]  Date: 09/30/2012  DOB: 29-Aug-1951  SIMULATION AND TREATMENT PLANNING NOTE  DIAGNOSIS:  Small Cell Lung CA s/p 4 cycles of chemo with partial response  NARRATIVE:  The patient was brought to the CT Simulation planning suite.  Identity was confirmed.  All relevant records and images related to the planned course of therapy were reviewed.  The patient freely provided informed written consent to proceed with treatment after reviewing the details related to the planned course of therapy. The consent form was witnessed and verified by the simulation staff.  Then, the patient was set-up in a stable reproducible  supine position for radiation therapy.  CT images were obtained.  Surface markings were placed.  The CT images were loaded into the planning software.  Then the target and avoidance structures were contoured.  Treatment planning then occurred.  The radiation prescription was entered and confirmed.  A total of 8 complex treatment devices were fabricated in the form of one thermoplastic mask, 2 MLC apertures shaping radiation around the brain and 5 MLC apertures shaping radiation around the lung cancer in the chest. I have requested : 3D Simulation  I have requested a DVH of the following structures: spinal cord, lungs, heart and targets on the chest plan.   PLAN:  The patient will receive 30 Gy in 10 fractions to the chest and 25 Gy in ten fractions to the brain for PCI..  ________________________________  Artist Pais Kathrynn Running, M.D.

## 2012-10-01 ENCOUNTER — Other Ambulatory Visit: Payer: Self-pay | Admitting: Physician Assistant

## 2012-10-01 DIAGNOSIS — I82409 Acute embolism and thrombosis of unspecified deep veins of unspecified lower extremity: Secondary | ICD-10-CM

## 2012-10-03 ENCOUNTER — Other Ambulatory Visit: Payer: Medicare Other | Admitting: Lab

## 2012-10-07 ENCOUNTER — Ambulatory Visit (HOSPITAL_BASED_OUTPATIENT_CLINIC_OR_DEPARTMENT_OTHER): Payer: Medicare Other | Admitting: Pharmacist

## 2012-10-07 ENCOUNTER — Other Ambulatory Visit (HOSPITAL_BASED_OUTPATIENT_CLINIC_OR_DEPARTMENT_OTHER): Payer: Medicare Other

## 2012-10-07 DIAGNOSIS — I82409 Acute embolism and thrombosis of unspecified deep veins of unspecified lower extremity: Secondary | ICD-10-CM

## 2012-10-07 DIAGNOSIS — Z86718 Personal history of other venous thrombosis and embolism: Secondary | ICD-10-CM

## 2012-10-07 DIAGNOSIS — Z7901 Long term (current) use of anticoagulants: Secondary | ICD-10-CM

## 2012-10-07 LAB — PROTIME-INR: INR: 2.1 (ref 2.00–3.50)

## 2012-10-07 NOTE — Progress Notes (Signed)
No changes pertaining to warfarin.  Pt starts XRT on Monday.  Continue warfarin 5mg  daily.  Will check PT/INR in 2 weeks.

## 2012-10-10 ENCOUNTER — Other Ambulatory Visit: Payer: Medicare Other | Admitting: Lab

## 2012-10-10 ENCOUNTER — Ambulatory Visit: Payer: Medicare Other

## 2012-10-11 ENCOUNTER — Telehealth: Payer: Self-pay | Admitting: Pharmacist

## 2012-10-11 NOTE — Telephone Encounter (Signed)
Left VM for pt regarding lab and coumadin clinic appointment on 10/21/12: Lab at 8:45am, Coumadin clinic at 9am and radiation at 9:30am.

## 2012-10-12 ENCOUNTER — Encounter: Payer: Self-pay | Admitting: Radiation Oncology

## 2012-10-12 ENCOUNTER — Ambulatory Visit
Admission: RE | Admit: 2012-10-12 | Discharge: 2012-10-12 | Disposition: A | Discharge status: Home / Self Care | Payer: Medicare Other | Source: Ambulatory Visit | Attending: Radiation Oncology | Admitting: Radiation Oncology

## 2012-10-13 ENCOUNTER — Ambulatory Visit
Admission: RE | Admit: 2012-10-13 | Discharge: 2012-10-13 | Disposition: A | Discharge status: Home / Self Care | Payer: Medicare Other | Source: Ambulatory Visit | Attending: Radiation Oncology | Admitting: Radiation Oncology

## 2012-10-13 ENCOUNTER — Inpatient Hospital Stay: Admission: RE | Admit: 2012-10-13 | Payer: Medicare Other | Source: Ambulatory Visit

## 2012-10-14 ENCOUNTER — Ambulatory Visit
Admission: RE | Admit: 2012-10-14 | Discharge: 2012-10-14 | Disposition: A | Discharge status: Home / Self Care | Payer: Medicare Other | Source: Ambulatory Visit | Attending: Radiation Oncology | Admitting: Radiation Oncology

## 2012-10-14 ENCOUNTER — Encounter: Payer: Self-pay | Admitting: Radiation Oncology

## 2012-10-14 VITALS — BP 108/62 | HR 87 | Temp 98.0°F | Resp 20 | Wt 144.1 lb

## 2012-10-14 DIAGNOSIS — C3490 Malignant neoplasm of unspecified part of unspecified bronchus or lung: Secondary | ICD-10-CM

## 2012-10-14 MED ORDER — BIAFINE EX EMUL
CUTANEOUS | Status: DC | PRN
  Administered 2012-10-14: 11:00:00 via TOPICAL

## 2012-10-14 MED ORDER — DEXAMETHASONE 4 MG PO TABS: 2.0000 mg | ORAL_TABLET | Freq: Two times a day (BID) | ORAL | Status: DC

## 2012-10-14 NOTE — Progress Notes (Signed)
  Radiation Oncology         [phone] ________________________________  Name: Anthony [name] MRN: [id number]  Date: 10/14/2012  DOB: 1951-03-13  Weekly Radiation Therapy Management  Current Dose: 6 Gy     Planned Dose:  30 Gy  Narrative . . . . . . . . The patient presents for routine under treatment assessment.              Patient here for weekly rad tx to chest and brain, 2/10 completed, Radiation therapy and you book given to patient , biafine cream to apply to affected areas after rad tx Daily, patient alert,oriented x3, 9$ % room air, sob with exertion, stated, coughing up little sputum=clear , pain in right hip constant, h/a yesterday,after Rad tx, ,not on decadron, no nausea, energy level better now chemotherapy has been completed                        Set-up films were reviewed.                                 The chart was checked. Physical Findings. . .  weight is 144 lb 1.6 oz (65.363 kg). His oral temperature is 98 F (36.7 C). His blood pressure is 108/62 and his pulse is 87. His respiration is 20 and oxygen saturation is 94%. . Weight essentially stable.  No significant changes. Impression . . . . . . . The patient is  tolerating radiation. Plan . . . . . . . . . . . . Continue treatment as planned.  ________________________________  Artist Pais. Kathrynn Running, M.D.

## 2012-10-14 NOTE — Progress Notes (Signed)
Patient  here for weekly rad tx to chest and brain, 2/10 completed,  Radiation therapy and you book given to patient , biafine cream to apply to affected areas after rad tx  Daily, patient alert,oriented x3, 9$ % room air, sob with exertion, stated, coughing up little sputum=clear , pain in right hip constant, h/a yesterday,after  Rad tx, ,not on decadron, no nausea, energy level better now chemotherapy has been completed.  10:09 AM

## 2012-10-14 NOTE — Addendum Note (Signed)
Encounter addended by: Lowella Petties, RN on: 10/14/2012 10:40 AM<BR>     Documentation filed: Inpatient MAR, Orders

## 2012-10-17 ENCOUNTER — Ambulatory Visit
Admission: RE | Admit: 2012-10-17 | Discharge: 2012-10-17 | Disposition: A | Discharge status: Home / Self Care | Payer: Medicare Other | Source: Ambulatory Visit | Attending: Radiation Oncology | Admitting: Radiation Oncology

## 2012-10-18 ENCOUNTER — Ambulatory Visit
Admission: RE | Admit: 2012-10-18 | Discharge: 2012-10-18 | Disposition: A | Discharge status: Home / Self Care | Payer: Medicare Other | Source: Ambulatory Visit | Attending: Radiation Oncology | Admitting: Radiation Oncology

## 2012-10-19 ENCOUNTER — Ambulatory Visit
Admission: RE | Admit: 2012-10-19 | Discharge: 2012-10-19 | Disposition: A | Discharge status: Home / Self Care | Payer: Medicare Other | Source: Ambulatory Visit | Attending: Radiation Oncology | Admitting: Radiation Oncology

## 2012-10-20 ENCOUNTER — Ambulatory Visit
Admission: RE | Admit: 2012-10-20 | Discharge: 2012-10-20 | Disposition: A | Discharge status: Home / Self Care | Payer: Medicare Other | Source: Ambulatory Visit | Attending: Radiation Oncology | Admitting: Radiation Oncology

## 2012-10-20 ENCOUNTER — Other Ambulatory Visit: Payer: Self-pay | Admitting: *Deleted

## 2012-10-20 ENCOUNTER — Encounter: Payer: Self-pay | Admitting: Radiation Oncology

## 2012-10-20 VITALS — BP 120/87 | HR 91 | Temp 97.9°F | Resp 20 | Wt 145.3 lb

## 2012-10-20 DIAGNOSIS — C3490 Malignant neoplasm of unspecified part of unspecified bronchus or lung: Secondary | ICD-10-CM

## 2012-10-20 NOTE — Progress Notes (Signed)
Pt c/o "mild headache", taking Decadron 1 mg BID. C/o increased SOB w/minimal activity, prod cough w/clear sputum, increased discomfort in chest. Pt continues to smoke 1 PPD, states "sometimes when he's SOB smoking opens up his lungs". States he has constant pain in right hip, Hydrocodone prn, does not take daily, he states.

## 2012-10-20 NOTE — Progress Notes (Signed)
  Radiation Oncology         [phone] ________________________________  Name: Anthony [name] MRN: [id number]  Date: 10/20/2012  DOB: [birth year]  Weekly Radiation Therapy Management  Current Dose: 15 Gy     Planned Dose:  25 Gy  Narrative . . . . . . . . The patient presents for routine under treatment assessment.                                                  The patient is without complaint except dyspnea                                 Set-up films were reviewed.                                 The chart was checked. Physical Findings. . .  weight is 145 lb 4.8 oz (65.908 kg). His oral temperature is 97.9 F (36.6 C). His blood pressure is 120/87 and his pulse is 91. His respiration is 20 and oxygen saturation is 96%. . Weight essentially stable.  No significant changes. Impression . . . . . . . The patient is  tolerating radiation. Plan . . . . . . . . . . . . Continue treatment as planned.  ________________________________  Artist Pais. Kathrynn Running, M.D.

## 2012-10-21 ENCOUNTER — Other Ambulatory Visit (HOSPITAL_BASED_OUTPATIENT_CLINIC_OR_DEPARTMENT_OTHER): Payer: Medicare Other | Admitting: Lab

## 2012-10-21 ENCOUNTER — Ambulatory Visit: Payer: Medicare Other | Admitting: Pharmacist

## 2012-10-21 ENCOUNTER — Ambulatory Visit
Admission: RE | Admit: 2012-10-21 | Discharge: 2012-10-21 | Disposition: A | Discharge status: Home / Self Care | Payer: Medicare Other | Source: Ambulatory Visit | Attending: Radiation Oncology | Admitting: Radiation Oncology

## 2012-10-21 DIAGNOSIS — I82409 Acute embolism and thrombosis of unspecified deep veins of unspecified lower extremity: Secondary | ICD-10-CM

## 2012-10-21 DIAGNOSIS — Z86718 Personal history of other venous thrombosis and embolism: Secondary | ICD-10-CM

## 2012-10-21 DIAGNOSIS — I82629 Acute embolism and thrombosis of deep veins of unspecified upper extremity: Secondary | ICD-10-CM

## 2012-10-21 LAB — PROTIME-INR
INR: 2.7 (ref 2.00–3.50)
Protime: 32.4 Seconds — ABNORMAL HIGH (ref 10.6–13.4)

## 2012-10-21 LAB — POCT INR: INR: 2.7

## 2012-10-21 NOTE — Progress Notes (Signed)
INR = 2.7 on 5 mg/day; 7.5 mg Sat. Pt has had minor HA (from prophylactic XRT).  Dr. Kathrynn Running started pt on Decadron 2 mg BIDAC on 10/14/12 for these HA.  Pt says there is enough pain to know his head hurts but it is "like a paper cut" and does not bother him a lot.  He has considered dosing Decadron up to 4 mg in the AM & 2 mg in the PM but when I alerted pt that the Decadron may increase his INR, he said he wanted to keep everything the way it was for now. If the HA's worsen & he has to increase his Decadron dose, he will call us & Dr. Kathrynn Running. Pt is going to the beach 11/05/12 through 11/12/12. INR at goal.  Continue same dose for now. Repeat INR on 11/13 to determine effect, if any, the Decadron may be having on INR. Pt knows to call us if he develops bleeding or other concerning adverse effect from his Coumadin. Marily Lente, Pharm.D.

## 2012-10-24 ENCOUNTER — Ambulatory Visit
Admission: RE | Admit: 2012-10-24 | Discharge: 2012-10-24 | Disposition: A | Discharge status: Home / Self Care | Payer: Medicare Other | Source: Ambulatory Visit | Attending: Radiation Oncology | Admitting: Radiation Oncology

## 2012-10-24 ENCOUNTER — Encounter: Payer: Self-pay | Admitting: Radiation Oncology

## 2012-10-24 VITALS — BP 127/98 | HR 88 | Temp 97.6°F | Resp 24

## 2012-10-24 DIAGNOSIS — C3490 Malignant neoplasm of unspecified part of unspecified bronchus or lung: Secondary | ICD-10-CM

## 2012-10-24 NOTE — Progress Notes (Signed)
Patient here c/o shortness breath and headaches, is taking decadron 2mg  po bid with meals, c/o this am with more coughing up green junk more in the mornings, no nausea, real bad coughing spell las tnight, has taken his symbicort and albuterol inhaler this am,head hurts "just enough to know it's hurting', only taking decadron for this, eating good stated , fatigue, not drinking enough gator aid , all drinks are caffeine free  10:06 AM

## 2012-10-25 ENCOUNTER — Ambulatory Visit
Admission: RE | Admit: 2012-10-25 | Discharge: 2012-10-25 | Disposition: A | Discharge status: Home / Self Care | Payer: Medicare Other | Source: Ambulatory Visit | Attending: Radiation Oncology | Admitting: Radiation Oncology

## 2012-10-25 NOTE — Progress Notes (Signed)
Department of Radiation Oncology  Phone:  [phone] Fax:        [phone]  Weekly Treatment Note    Name: Anthony [name] Date: 10/25/2012 MRN: [id number] DOB: 11/28/1951   Current dose: 20 Gy  Current fraction: 8   MEDICATIONS: Current Outpatient Prescriptions  Medication Sig Dispense Refill  . albuterol (PROVENTIL HFA;VENTOLIN HFA) 108 (90 BASE) MCG/ACT inhaler Inhale 2 puffs into the lungs every 6 (six) hours as needed. For shortness of breath.      . budesonide-formoterol (SYMBICORT) 160-4.5 MCG/ACT inhaler Inhale 2 puffs into the lungs 2 (two) times daily.      . cyanocobalamin (,VITAMIN B-12,) 1000 MCG/ML injection Inject into the muscle every 30 (thirty) days. Administers on the 15th of each month.      . dexamethasone (DECADRON) 4 MG tablet Take 0.5 tablets (2 mg total) by mouth 2 (two) times daily with a meal.  30 tablet  5  . emollient (BIAFINE) cream Apply 1 application topically daily. Apply to affected areas daily after rad tx      . Hydrocodone-Acetaminophen (VICODIN HP) 10-300 MG TABS Take 1-2 tablets by mouth daily as needed. For pain.      Marland Kitchen lidocaine-prilocaine (EMLA) cream Apply topically as needed. Apply to port 1hr before chemo  30 g  0  . LORazepam (ATIVAN) 1 MG tablet Take 1 mg by mouth 3 (three) times daily.      . phenytoin (DILANTIN) 100 MG ER capsule Take 200 mg by mouth 2 (two) times daily.      . phenytoin (DILANTIN) 30 MG ER capsule Take 60 mg by mouth 2 (two) times daily.      . prochlorperazine (COMPAZINE) 10 MG tablet Take 10 mg by mouth every 6 (six) hours as needed. For chemo-related nausea.      . promethazine (PHENERGAN) 25 MG tablet Take 25 mg by mouth daily as needed. For nausea.      Marland Kitchen tiotropium (SPIRIVA) 18 MCG inhalation capsule Place 18 mcg into inhaler and inhale daily.      Marland Kitchen warfarin (COUMADIN) 5 MG tablet TAKE 1 TABLET BY MOUTH ONCE A DAY BETWEEN 4-6PM OR AS INSTRUCTED BYPHYSICIAN  50 tablet  1     ALLERGIES: Review of  patient's allergies indicates no known allergies.   LABORATORY DATA:  Lab Results  Component Value Date   WBC 8.1 09/19/2012   HGB 9.9* 09/19/2012   HCT 30.0* 09/19/2012   MCV 97.1 09/19/2012   PLT 458* 09/19/2012   Lab Results  Component Value Date   NA 136 09/19/2012   K 4.7 09/19/2012   CL 103 09/19/2012   CO2 22 09/19/2012   Lab Results  Component Value Date   ALT 10 09/19/2012   AST 15 09/19/2012   ALKPHOS 108 09/19/2012   BILITOT 0.20 09/19/2012     NARRATIVE: Anthony [name] was seen today for weekly treatment management. The chart was checked and the patient's films were reviewed. The patient wanted to be seen today with some change in his sputum. He states that he has been coughing especially in the morning and he has brought up some green sputum. During the rest of the day this has been clear. His headaches he states has been improving with an increase in steroids through Dr. Kathrynn Running. He denies any fevers or worsening shortness of breath.  PHYSICAL EXAMINATION: oral temperature is 97.6 F (36.4 C). His blood pressure is 127/98 and his pulse is 88. His respiration is  24 and oxygen saturation is 96%.      the patient's lungs are clear to auscultation today bilaterally  ASSESSMENT: The patient is doing satisfactorily with treatment.  PLAN: We will continue with the patient's radiation treatment as planned. I discussed with the patient that we could follow this. A change in color alone with this sputum is not an indication for antibiotics for a potential bacterial infection. The patient is to let us know if he has any worsening symptoms and I suspect that I will see him again in 2 days on his final treatment today. We can see how he is doing at that time.

## 2012-10-26 ENCOUNTER — Ambulatory Visit (HOSPITAL_BASED_OUTPATIENT_CLINIC_OR_DEPARTMENT_OTHER): Payer: Medicare Other | Admitting: Pharmacist

## 2012-10-26 ENCOUNTER — Ambulatory Visit
Admission: RE | Admit: 2012-10-26 | Discharge: 2012-10-26 | Disposition: A | Discharge status: Home / Self Care | Payer: Medicare Other | Source: Ambulatory Visit | Attending: Radiation Oncology | Admitting: Radiation Oncology

## 2012-10-26 ENCOUNTER — Encounter: Payer: Self-pay | Admitting: Radiation Oncology

## 2012-10-26 ENCOUNTER — Other Ambulatory Visit: Payer: Medicare Other | Admitting: Lab

## 2012-10-26 DIAGNOSIS — Z86718 Personal history of other venous thrombosis and embolism: Secondary | ICD-10-CM

## 2012-10-26 DIAGNOSIS — I82409 Acute embolism and thrombosis of unspecified deep veins of unspecified lower extremity: Secondary | ICD-10-CM

## 2012-10-26 NOTE — Progress Notes (Signed)
INR = 4.57 on 5 mg/day; 7.5 mg Sat. With no signs of bleeding and no complaints. Pt has had minor HA (from prophylactic XRT). Dr. Kathrynn Running started pt on Decadron 2 mg BIDAC on 10/14/12 for these HA. Pt states he may stop decadron after today if HA are resolved. If the HA's worsen & he has to increase his Decadron dose, he will call us & Dr. Kathrynn Running.  Pt is going to the beach 11/05/12 through 11/12/12.  INR now elevated but no issues with bleeding or bruising. No vit K today but will hold coumadin today and tomorrow and then have patient take 2.5 mg on Friday and then change Saturday's dose down to 5 mg. Continue with 5 mg daily starting and  Repeat INR on 11/21 to determine if more dose changes are necessary and if patient is still requiring decadron.  Pt knows to call us if he develops bleeding or other concerning adverse effect from his Coumadin.  Christell Faith, Pharm.D.

## 2012-10-26 NOTE — Patient Instructions (Addendum)
No Coumadin Today or Tomorrow Only take 2.5 mg (half tablet) on Friday Then take 5 mg daily starting on Saturday.  Return in 1 week to reassess on 10/21 (Thursday) Increase green leafy vegetables in the diet

## 2012-10-28 NOTE — Progress Notes (Signed)
  Radiation Oncology         [phone] ________________________________  Name: Anthony [name] MRN: [id number]  Date: 10/12/2012  DOB: 11-02-1951  Simulation Verification Note  Status:  outpatient  NARRATIVE: The patient was brought to the treatment unit and placed in the planned treatment position. The clinical setup was verified. Then port films were obtained and uploaded to the radiation oncology medical record software.  The treatment beams were carefully compared against the planned radiation fields. The position location and shape of the radiation fields was reviewed. They targeted volume of tissue appears to be appropriately covered by the radiation beams. Organs at risk appear to be excluded as planned.  Based on my personal review, I approved the simulation verification. The patient's treatment will proceed as planned.  ------------------------------------------------  Artist Pais Kathrynn Running, M.D.

## 2012-10-28 NOTE — Progress Notes (Signed)
  Radiation Oncology         [phone] ________________________________  Name: Anthony [name] MRN: [id number]  Date: 10/26/2012  DOB: March 15, 1951  End of Treatment Note  Diagnosis:   61 years old white male with extensive stage small cell lung cancer status post 4 cycles of systemic chemotherapy with carboplatin and etoposide with partial response  Indication for treatment:  Palliation of thoracic disease with prophylactic cranial irradiation to prevent brain metastases       Radiation treatment dates:   10/13/2012-10/26/2012  Site/dose:    1.  the patient's central chest was treated to 30 gray in 10 fractions of 3 gray 2.  the patient's brain was treated to 25 gray in 10 fractions of 2.5 gray.  Beams/energy:    1.  the patient's central chest was treated using 3 fields. Most to the radiation was delivered from the left anterior oblique and right posterior oblique gantry positions using 6 megavolt and 10 megavolt photons respectively. He lightly weighted right anterior oblique field with 6 megavolt photons was added to centralize the radiation dose and avoid hot spots at the entry and exit points of the primary beams. The patient was set up using cone beam CT imaging daily and his portals were verified 2.   the patient's whole brain was encompassed in the right and left lateral 6 megavolt photon fields with independent collimation for each gantry angle to avoid transit of radiation through the eyes and face while covering the entire intracranial contents. The patient was set up with a aqua plastic thermoplastic head cast and his position was verified with port films.   Narrative: The patient tolerated radiation treatment relatively well.    the patient developed some headaches which may be attributed to cerebral edema from his radiation to the brain. Consequently, he was given dexamethasone 2 mg twice a day. He also developed some bronchitis symptoms during his second weekly radiation which was  managed supportively.   Plan: The patient has completed radiation treatment. The patient will return to radiation oncology clinic for routine followup in one month. I advised them to call or return sooner if they have any questions or concerns related to their recovery or treatment. ________________________________  Artist Pais. Kathrynn Running, M.D.

## 2012-11-03 ENCOUNTER — Telehealth: Payer: Self-pay | Admitting: *Deleted

## 2012-11-03 ENCOUNTER — Ambulatory Visit (HOSPITAL_BASED_OUTPATIENT_CLINIC_OR_DEPARTMENT_OTHER): Payer: Medicare Other | Admitting: Pharmacist

## 2012-11-03 ENCOUNTER — Other Ambulatory Visit (HOSPITAL_BASED_OUTPATIENT_CLINIC_OR_DEPARTMENT_OTHER): Payer: Medicare Other

## 2012-11-03 DIAGNOSIS — Z86718 Personal history of other venous thrombosis and embolism: Secondary | ICD-10-CM

## 2012-11-03 DIAGNOSIS — I82629 Acute embolism and thrombosis of deep veins of unspecified upper extremity: Secondary | ICD-10-CM

## 2012-11-03 DIAGNOSIS — I82409 Acute embolism and thrombosis of unspecified deep veins of unspecified lower extremity: Secondary | ICD-10-CM

## 2012-11-03 LAB — PROTIME-INR
INR: 1.7 — ABNORMAL LOW (ref 2.00–3.50)
Protime: 20.4 Seconds — ABNORMAL HIGH (ref 10.6–13.4)

## 2012-11-03 LAB — POCT INR: INR: 1.7

## 2012-11-03 NOTE — Telephone Encounter (Signed)
Per [facility], Coumadin Clinic pt was questioning whether to continue w/Decadron until FU w/Dr Kathrynn Running on 11/24/12. His current dose is 2 mg daily. She states pt said he is "having one HA a day". Per Dr Kathrynn Running pt to continue current dose if his HA are tolerable, stop med on 11/14/12. Informed wife of this instruction; she verbalized understanding. Notified Brandy in pharmacy of dr [employer] instructions for pt.

## 2012-11-03 NOTE — Progress Notes (Signed)
Pt held coumadin on 11/13 and 11/14. He restarted coumadin on 11/15. Pt has been taking 5mg  daily since 11/15. No problems to report regarding anticoagulation. His headaches are better. Stated he has ~ 1 headache daily since he completed radiation. He has been taking dexamethasone 2mg  daily since 11/2 (not BIDAC as listed). Pt knows to call us if he increases his dose of steroids. He is not a fan of steroids and their side effects. Pt will be going to the beach 11/22 -11/30. Will recheck INR after pt returns from the beach. Continue 5mg  daily. Will recheck INR on 11/14/12; lab at 8:15am and coumadin clinic at 8:30am.

## 2012-11-03 NOTE — Patient Instructions (Addendum)
Continue coumadin 5mg  daily. Recheck INR after returning from beach trip.  Will recheck INR on 11/14/12; lab at 8:15am and coumadin clinic at 8:30am.

## 2012-11-14 ENCOUNTER — Ambulatory Visit: Payer: Medicare Other

## 2012-11-14 ENCOUNTER — Other Ambulatory Visit: Payer: Medicare Other

## 2012-11-15 ENCOUNTER — Telehealth: Payer: Self-pay | Admitting: Pharmacist

## 2012-11-16 ENCOUNTER — Ambulatory Visit (HOSPITAL_BASED_OUTPATIENT_CLINIC_OR_DEPARTMENT_OTHER): Payer: Medicare Other | Admitting: Pharmacist

## 2012-11-16 ENCOUNTER — Other Ambulatory Visit (HOSPITAL_BASED_OUTPATIENT_CLINIC_OR_DEPARTMENT_OTHER): Payer: Medicare Other | Admitting: Lab

## 2012-11-16 DIAGNOSIS — Z86718 Personal history of other venous thrombosis and embolism: Secondary | ICD-10-CM

## 2012-11-16 DIAGNOSIS — I82629 Acute embolism and thrombosis of deep veins of unspecified upper extremity: Secondary | ICD-10-CM

## 2012-11-16 DIAGNOSIS — I82409 Acute embolism and thrombosis of unspecified deep veins of unspecified lower extremity: Secondary | ICD-10-CM

## 2012-11-16 LAB — PROTIME-INR: Protime: 25.2 Seconds — ABNORMAL HIGH (ref 10.6–13.4)

## 2012-11-16 NOTE — Patient Instructions (Addendum)
INR at goal [place] job! No Changes Continue Coumadin 5 mg daily Recheck next week on 12/12 when come back to see Dr. Kathrynn Running  Lab at 8am coumadin at 8:15am

## 2012-11-16 NOTE — Progress Notes (Signed)
INR at goal this week (2.1). Pt states no missed doses or diet changes and no bleeding issues. Continue same dose of 5 mg daily. Pt returns next week on 12/12 to see Dr. Kathrynn Running will check pt INR at this time as well since patient lives ~38miles away. Schedule Lab and coumadin clinic for 11/24/12 with lab at 8am and coumadin at 8:15am followed by appointment with Dr. Kathrynn Running at 8:30am.

## 2012-11-18 ENCOUNTER — Encounter: Payer: Self-pay | Admitting: Radiation Oncology

## 2012-11-18 DIAGNOSIS — K5010 Crohn's disease of large intestine without complications: Secondary | ICD-10-CM | POA: Insufficient documentation

## 2012-11-18 DIAGNOSIS — N2889 Other specified disorders of kidney and ureter: Secondary | ICD-10-CM | POA: Insufficient documentation

## 2012-11-18 DIAGNOSIS — R05 Cough: Secondary | ICD-10-CM | POA: Insufficient documentation

## 2012-11-18 DIAGNOSIS — J439 Emphysema, unspecified: Secondary | ICD-10-CM | POA: Insufficient documentation

## 2012-11-18 DIAGNOSIS — Z923 Personal history of irradiation: Secondary | ICD-10-CM | POA: Insufficient documentation

## 2012-11-18 DIAGNOSIS — R634 Abnormal weight loss: Secondary | ICD-10-CM | POA: Insufficient documentation

## 2012-11-18 DIAGNOSIS — G40909 Epilepsy, unspecified, not intractable, without status epilepticus: Secondary | ICD-10-CM | POA: Insufficient documentation

## 2012-11-18 DIAGNOSIS — R079 Chest pain, unspecified: Secondary | ICD-10-CM | POA: Insufficient documentation

## 2012-11-18 DIAGNOSIS — M1990 Unspecified osteoarthritis, unspecified site: Secondary | ICD-10-CM | POA: Insufficient documentation

## 2012-11-18 DIAGNOSIS — I2699 Other pulmonary embolism without acute cor pulmonale: Secondary | ICD-10-CM | POA: Insufficient documentation

## 2012-11-18 DIAGNOSIS — J449 Chronic obstructive pulmonary disease, unspecified: Secondary | ICD-10-CM | POA: Insufficient documentation

## 2012-11-18 DIAGNOSIS — R531 Weakness: Secondary | ICD-10-CM | POA: Insufficient documentation

## 2012-11-24 ENCOUNTER — Ambulatory Visit: Payer: Medicare Other | Admitting: Radiation Oncology

## 2012-11-24 ENCOUNTER — Other Ambulatory Visit (HOSPITAL_BASED_OUTPATIENT_CLINIC_OR_DEPARTMENT_OTHER): Payer: Medicare Other | Admitting: Lab

## 2012-11-24 ENCOUNTER — Ambulatory Visit (HOSPITAL_BASED_OUTPATIENT_CLINIC_OR_DEPARTMENT_OTHER): Payer: Medicare Other | Admitting: Pharmacist

## 2012-11-24 DIAGNOSIS — Z5181 Encounter for therapeutic drug level monitoring: Secondary | ICD-10-CM

## 2012-11-24 DIAGNOSIS — Z7901 Long term (current) use of anticoagulants: Secondary | ICD-10-CM

## 2012-11-24 DIAGNOSIS — Z86718 Personal history of other venous thrombosis and embolism: Secondary | ICD-10-CM

## 2012-11-24 DIAGNOSIS — I82409 Acute embolism and thrombosis of unspecified deep veins of unspecified lower extremity: Secondary | ICD-10-CM

## 2012-11-24 LAB — POCT INR: INR: 3.81

## 2012-11-24 LAB — PROTHROMBIN TIME
INR: 3.81 — ABNORMAL HIGH (ref <1.50)
Prothrombin Time: 35.3 seconds — ABNORMAL HIGH (ref 11.6–15.2)

## 2012-11-24 LAB — PROTIME-INR

## 2012-11-24 NOTE — Progress Notes (Signed)
Pt had send out INR return at 3.81. After holding todays dose will change coumadin slightly with 5mg daily and 2.5 mg on M/F.  Will recheck INR on 12/12/12 with scan appointment; lab at 12:15pm and coumadin clinic at 2p (after scan). 

## 2012-11-24 NOTE — Patient Instructions (Signed)
Pt had send out INR return at 3.81. After holding todays dose will change coumadin slightly with 5mg  daily and 2.5 mg on M/F.  Will recheck INR on 12/12/12 with scan appointment; lab at 12:15pm and coumadin clinic at 2p (after scan).

## 2012-12-08 ENCOUNTER — Encounter: Payer: Self-pay | Admitting: Radiation Oncology

## 2012-12-08 DIAGNOSIS — J439 Emphysema, unspecified: Secondary | ICD-10-CM | POA: Insufficient documentation

## 2012-12-12 ENCOUNTER — Ambulatory Visit (HOSPITAL_COMMUNITY)
Admission: RE | Admit: 2012-12-12 | Discharge: 2012-12-12 | Disposition: A | Discharge status: Home / Self Care | Payer: Medicare Other | Source: Ambulatory Visit | Attending: Internal Medicine | Admitting: Internal Medicine

## 2012-12-12 ENCOUNTER — Other Ambulatory Visit (HOSPITAL_BASED_OUTPATIENT_CLINIC_OR_DEPARTMENT_OTHER): Payer: Medicare Other | Admitting: Lab

## 2012-12-12 ENCOUNTER — Ambulatory Visit (HOSPITAL_BASED_OUTPATIENT_CLINIC_OR_DEPARTMENT_OTHER): Payer: Medicare Other | Admitting: Pharmacist

## 2012-12-12 DIAGNOSIS — C3490 Malignant neoplasm of unspecified part of unspecified bronchus or lung: Secondary | ICD-10-CM | POA: Insufficient documentation

## 2012-12-12 DIAGNOSIS — I82409 Acute embolism and thrombosis of unspecified deep veins of unspecified lower extremity: Secondary | ICD-10-CM

## 2012-12-12 DIAGNOSIS — Z923 Personal history of irradiation: Secondary | ICD-10-CM | POA: Insufficient documentation

## 2012-12-12 DIAGNOSIS — Z9221 Personal history of antineoplastic chemotherapy: Secondary | ICD-10-CM | POA: Insufficient documentation

## 2012-12-12 DIAGNOSIS — Z86718 Personal history of other venous thrombosis and embolism: Secondary | ICD-10-CM

## 2012-12-12 LAB — COMPREHENSIVE METABOLIC PANEL (CC13)
ALT: 9 U/L (ref 0–55)
Albumin: 3.7 g/dL (ref 3.5–5.0)
CO2: 28 mEq/L (ref 22–29)
Calcium: 10 mg/dL (ref 8.4–10.4)
Chloride: 100 mEq/L (ref 98–107)
Creatinine: 1.3 mg/dL (ref 0.7–1.3)
Potassium: 4.3 mEq/L (ref 3.5–5.1)

## 2012-12-12 LAB — PROTIME-INR
INR: 1.8 — ABNORMAL LOW (ref 2.00–3.50)
Protime: 21.6 Seconds — ABNORMAL HIGH (ref 10.6–13.4)

## 2012-12-12 LAB — CBC WITH DIFFERENTIAL/PLATELET
BASO%: 0.4 % (ref 0.0–2.0)
EOS%: 1.1 % (ref 0.0–7.0)
HCT: 42.6 % (ref 38.4–49.9)
MCH: 32.8 pg (ref 27.2–33.4)
MCHC: 34 g/dL (ref 32.0–36.0)
MONO#: 0.5 10*3/uL (ref 0.1–0.9)
NEUT%: 72.4 % (ref 39.0–75.0)
RBC: 4.41 10*6/uL (ref 4.20–5.82)
RDW: 16.4 % — ABNORMAL HIGH (ref 11.0–14.6)
WBC: 6.2 10*3/uL (ref 4.0–10.3)
lymph#: 1.1 10*3/uL (ref 0.9–3.3)

## 2012-12-12 LAB — POCT INR: INR: 1.8

## 2012-12-12 MED ORDER — IOHEXOL 300 MG/ML  SOLN
100.0000 mL | Freq: Once | INTRAMUSCULAR | Status: AC | PRN
  Administered 2012-12-12: 100 mL via INTRAVENOUS

## 2012-12-12 NOTE — Progress Notes (Signed)
INR = 1.8 on 5 mg/day; 2.5 mg Tu/Th (pt changed his 2 days of 2.5 mg) Pt was on steroid a few weeks ago when his INR jumped up to 3.8.  No longer on steroids. No bleeding.  No s/sxs of VTE. INR subtherapeutic.  Off steroids.  Increase dose back up to 5 mg/day. Repeat INR in 10 days. Marily Lente, Pharm.D.

## 2012-12-15 ENCOUNTER — Ambulatory Visit (HOSPITAL_BASED_OUTPATIENT_CLINIC_OR_DEPARTMENT_OTHER): Payer: Medicare HMO | Admitting: Internal Medicine

## 2012-12-15 ENCOUNTER — Encounter: Payer: Self-pay | Admitting: Radiation Oncology

## 2012-12-15 ENCOUNTER — Ambulatory Visit
Admission: RE | Admit: 2012-12-15 | Discharge: 2012-12-15 | Disposition: A | Discharge status: Home / Self Care | Payer: Medicare Other | Source: Ambulatory Visit | Attending: Radiation Oncology | Admitting: Radiation Oncology

## 2012-12-15 ENCOUNTER — Encounter: Payer: Self-pay | Admitting: Internal Medicine

## 2012-12-15 ENCOUNTER — Telehealth: Payer: Self-pay | Admitting: Internal Medicine

## 2012-12-15 VITALS — BP 108/69 | HR 96 | Temp 98.1°F | Resp 20 | Ht 71.5 in | Wt 141.0 lb

## 2012-12-15 VITALS — BP 122/73 | HR 99 | Temp 97.9°F | Resp 20 | Wt 142.5 lb

## 2012-12-15 DIAGNOSIS — C3490 Malignant neoplasm of unspecified part of unspecified bronchus or lung: Secondary | ICD-10-CM

## 2012-12-15 DIAGNOSIS — N2889 Other specified disorders of kidney and ureter: Secondary | ICD-10-CM

## 2012-12-15 DIAGNOSIS — N289 Disorder of kidney and ureter, unspecified: Secondary | ICD-10-CM

## 2012-12-15 NOTE — Progress Notes (Signed)
[facility] [phone]   [phone]  OFFICE PROGRESS NOTE  Lillia Mountain, MD [address], [address] [employer], [place]. [place] [address]  DIAGNOSIS:  1. Extensive stage small cell lung cancer diagnosed in June of 2013 2. Right upper extremity deep vein thrombosis diagnosed 08/19/2012 3. Prophylactic cranial irradiation completed 10/26/2012 under the care of Dr. Kathrynn Running. 4. Palliative radiotherapy to the central right chest mass under the care of Dr. Kathrynn Running completed 10/26/2012.  PRIOR THERAPY:  1) Status post 4 cycles of systemic chemotherapy with carboplatin for AUC of 5 given on day 1 and etoposide 100 mg/M2 given on days 1, 2 and 3 with Neulasta support on day 4 last cycle of chemotherapy was given on 08/22/2012 with partial response.  2) Prophylactic cranial irradiation completed 10/26/2012 under the care of Dr. Kathrynn Running. 3) Palliative radiotherapy to the central right chest mass under the care of Dr. Kathrynn Running completed 10/26/2012.  CURRENT THERAPY: Coumadin at 5 mg by mouth daily or as directed by the [facility] Coumadin clinic.   INTERVAL HISTORY: Anthony [name] 62 y.o. male returns to the clinic today for followup visit accompanied by his wife and brother. The patient is feeling fine with no specific complaints today except for occasional nausea. He tolerated the previous course of the prophylactic cranial irradiation as well as palliative radiotherapy to the central right chest mass fairly well under the care of Dr. Kathrynn Running. He denied having any significant chest pain, shortness breath, cough or hemoptysis. He denied having any significant weight loss or night sweats. The patient is followed closely by Dr. Laverle Patter for questionable renal cell carcinoma of the right kidney and he is followed by observation. He had repeat CT scan of the chest, abdomen and pelvis performed recently and he is here today  for evaluation and discussion of his scan results.  MEDICAL HISTORY: Past Medical History  Diagnosis Date  . Crohn's disease of colon   . Pulmonary emboli   . COPD (chronic obstructive pulmonary disease)   . Seizure disorder   . Kidney mass   . Arthritis   . Emphysema of lung   . Weight loss, unintentional   . Cough   . Chest pain   . Weakness   . Cancer 06/09/12    lung RUL  . Lung cancer 06/09/12    biopsy- RUL neuroendocrine carcinoma, small cell type  . Seizures     last one ~ 3 yrs ago  . Hx of radiation therapy 10/13/12 -10/26/12    central chest, brain-prophylacitc    ALLERGIES:   has no known allergies.  MEDICATIONS:  Current Outpatient Prescriptions  Medication Sig Dispense Refill  . albuterol (PROVENTIL HFA;VENTOLIN HFA) 108 (90 BASE) MCG/ACT inhaler Inhale 2 puffs into the lungs every 6 (six) hours as needed. For shortness of breath.      . budesonide-formoterol (SYMBICORT) 160-4.5 MCG/ACT inhaler Inhale 2 puffs into the lungs 2 (two) times daily.      . cyanocobalamin (,VITAMIN B-12,) 1000 MCG/ML injection Inject into the muscle every 30 (thirty) days. Administers on the 15th of each month.      . emollient (BIAFINE) cream Apply 1 application topically daily. Apply to affected areas daily after rad tx      . Hydrocodone-Acetaminophen (VICODIN HP) 10-300 MG TABS Take 1-2 tablets by mouth daily as needed. For pain.      Marland Kitchen lidocaine-prilocaine (EMLA) cream Apply topically as  needed. Apply to port 1hr before chemo  30 g  0  . LORazepam (ATIVAN) 1 MG tablet Take 1 mg by mouth 3 (three) times daily.      . phenytoin (DILANTIN) 100 MG ER capsule Take 200 mg by mouth 2 (two) times daily.      . phenytoin (DILANTIN) 30 MG ER capsule Take 60 mg by mouth 2 (two) times daily.      . prochlorperazine (COMPAZINE) 10 MG tablet Take 10 mg by mouth every 6 (six) hours as needed. For chemo-related nausea.      . promethazine (PHENERGAN) 25 MG tablet Take 25 mg by mouth daily as  needed. For nausea.      Marland Kitchen tiotropium (SPIRIVA) 18 MCG inhalation capsule Place 18 mcg into inhaler and inhale daily.      Marland Kitchen warfarin (COUMADIN) 5 MG tablet TAKE 1 TABLET BY MOUTH ONCE A DAY BETWEEN 4-6PM OR AS INSTRUCTED BYPHYSICIAN  50 tablet  1    SURGICAL HISTORY:  Past Surgical History  Procedure Date  . Arm surgery     left  . Right knee     right knee, hx of injury  . Rectal surgery     x7, Crohn's disease  . Abdominal surgery     x3, Crohn's disease  . Video bronchoscopy 06/09/2012    Procedure: VIDEO BRONCHOSCOPY WITHOUT FLUORO;  Surgeon: Leslye Peer, MD;  Location: [facility];  Service: Cardiopulmonary;  Laterality: Bilateral;  . Portacath placement 07/05/2012    Procedure: INSERTION PORT-A-CATH;  Surgeon: Mariella Saa, MD;  Location: [facility];  Service: General;  Laterality: N/A;  . Port-a-cath removal 08/24/2012    Procedure: MINOR REMOVAL PORT-A-CATH;  Surgeon: Mariella Saa, MD;  Location: [facility];  Service: General;  Laterality: Right;    REVIEW OF SYSTEMS:  A comprehensive review of systems was negative except for: Gastrointestinal: positive for nausea   PHYSICAL EXAMINATION: General appearance: alert, cooperative and no distress Head: Normocephalic, without obvious abnormality, atraumatic Neck: no adenopathy Lymph nodes: Cervical, supraclavicular, and axillary nodes normal. Resp: clear to auscultation bilaterally Cardio: regular rate and rhythm, S1, S2 normal, no murmur, click, rub or gallop GI: soft, non-tender; bowel sounds normal; no masses,  no organomegaly Extremities: extremities normal, atraumatic, no cyanosis or edema  ECOG PERFORMANCE STATUS: 1 - Symptomatic but completely ambulatory  There were no vitals taken for this visit.  LABORATORY DATA: Lab Results  Component Value Date   WBC 6.2 12/12/2012   HGB 14.5 12/12/2012   HCT 42.6 12/12/2012   MCV 96.6 12/12/2012   PLT 241 12/12/2012      Chemistry        Component Value Date/Time   NA 141 12/12/2012 1206   NA 142 08/01/2012 1205   K 4.3 12/12/2012 1206   K 4.1 08/01/2012 1205   CL 100 12/12/2012 1206   CL 103 08/01/2012 1205   CO2 28 12/12/2012 1206   CO2 26 08/01/2012 1205   BUN 11.0 12/12/2012 1206   BUN 15 08/01/2012 1205   CREATININE 1.3 12/12/2012 1206   CREATININE 1.22 08/01/2012 1205      Component Value Date/Time   CALCIUM 10.0 12/12/2012 1206   CALCIUM 7.7* 08/01/2012 1205   ALKPHOS 102 12/12/2012 1206   ALKPHOS 131* 08/01/2012 1205   AST 17 12/12/2012 1206   AST 13 08/01/2012 1205   ALT 9 12/12/2012 1206   ALT 10 08/01/2012 1205   BILITOT 0.33 12/12/2012 1206  BILITOT 0.2* 08/01/2012 1205       RADIOGRAPHIC STUDIES: Ct Chest W Contrast  12/12/2012  *RADIOLOGY REPORT*  Clinical Data:  Restaging lung cancer.  Chemotherapy and radiation therapy complete.  Right-sided chest pain.  CT CHEST, ABDOMEN AND PELVIS WITH CONTRAST  Technique:  Multidetector CT imaging of the chest, abdomen and pelvis was performed following the standard protocol during bolus administration of intravenous contrast.  Contrast: OMNIPAQUE IOHEXOL 300 MG/ML  SOLN  Comparison:  CT 09/09/2012   CT CHEST  Findings:  No axillary or supraclavicular lymphadenopathy.  No mediastinal or hilar lymphadenopathy.  Small paratracheal lymph nodes are stable compared to prior.  No pericardial fluid.  Review of the lung parenchyma demonstrates decrease in size of right perihilar mass which measures 2.8 x 2.1 cm compared to 3.6 x 3.2 cm on the most recent comparison remeasured.  A small right upper lobe lesion measures 7 mm (image 19) unchanged from 7 mm on prior.  Linear nodularity in the left upper lobe (image 27) is unchanged.  No new pulmonary nodules.  IMPRESSION:  1.  Interval decrease in size of dominant right hilar mass. 2.  Stable bilateral pulmonary nodules.   CT ABDOMEN AND PELVIS  Findings:  No focal hepatic lesion.  The pancreas, spleen, and adrenal glands are  normal.  Enhancing round lesion extending  from  the medial aspect of the left  kidney measures 2.9 x 3.1 cm compared to  2.6 x 2.9  cm for slight increase in size.  There multiple additional nonenhancing bilateral renal cysts.  The stomach, small bowel, and colon are unchanged.  There is a left lower quadrant colostomy.  Rectal stump is unchanged.  No retroperitoneal or mesenteric lymphadenopathy.  No peritoneal disease.  In the pelvis, bladder prostate gland are normal.  No pelvic lymphadenopathy.  IMPRESSION:  1.  No evidence of metastasis in the abdomen or pelvis.  2.  Interval mild enlargement of enhancing left renal mass concerning for primary renal cell carcinoma.   Original Report Authenticated By: Genevive Bi, M.D.    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 The patient is doing fine and has no significant evidence for disease progression except for slight increase in the size of the right renal mass.  PLAN: I discussed the scan results with the patient and his family. I recommended for him to continue on observation for now with repeat CT scan of the chest, abdomen and pelvis in 3 months. The patient is followed closely by Dr. Laverle Patter for the questionable renal cell carcinoma. He was advised to call me immediately if he has any concerning symptoms in the interval.  All questions were answered. The patient knows to call the clinic with any problems, questions or concerns. We can certainly see the patient much sooner if necessary.

## 2012-12-15 NOTE — Telephone Encounter (Signed)
gv and printed appt schedule for March 2014....gv pt Barium..the patient aware central scheduling will contact with d/t

## 2012-12-15 NOTE — Patient Instructions (Addendum)
CT scan of the chest showed mild decrease in the right lung nodule on CT scan of the abdomen showed mild increase in the size of the right adrenal mass. Continue on observation for now with repeat CT scan of the chest, abdomen and pelvis in 3 months

## 2012-12-15 NOTE — Progress Notes (Addendum)
Pt c/o constant sharp, shooting pains in right chest since "last 3 radiation treatments". He takes Hydrocodone 1 tab only nightly. He rates pain 5/10 this morning but states it can be 8-9/10. He states it is worse when he is supine. He states he "tries to tough it out during the day" but sometimes does take Hydrocodone 1 tab during day. C/o prod cough w/thick clear sputum, SOB w/exertion, fatigue, appetite varies,occasional nausea but denies vomiting. Pt not on steroid therapy, on Dilantin. Pt continues to smoke 1 PPD.

## 2012-12-15 NOTE — Progress Notes (Signed)
Radiation Oncology         [phone] ________________________________  Name: Anthony [name] MRN: [id number]  Date: 12/15/2012  DOB: 05-Nov-1951  Follow-Up Visit Note  CC: Lillia Mountain, MD  Si Gaul, MD  Diagnosis:   62 yo man with extensive stage small cell lung cancer status post 4 cycles of systemic chemotherapy with carboplatin and etoposide with partial response followed by palliative thoracic disease with prophylactic cranial irradiation from 10/13/2012-10/26/2012 where the chest was treated to 30 gray in 10 fractions of 3 gray and brain was treated to 25 gray in 10 fractions of 2.5 gray  Interval Since Last Radiation:  1 months  Narrative:  The patient returns today for routine follow-up.  Pt c/o constant sharp, shooting pains in right chest since "last 3 radiation treatments". He takes Hydrocodone 1 tab only nightly. He rates pain 5/10 this morning but states it can be 8-9/10. He states it is worse when he is supine. He states he "tries to tough it out during the day" but sometimes does take Hydrocodone 1 tab during day. C/o prod cough w/thick clear sputum, SOB w/exertion, fatigue, appetite varies,occasional nausea but denies vomiting. Pt not on steroid therapy, on Dilantin.  Pt continues to smoke 1 PPD                             ALLERGIES:   has no known allergies.  Meds: Current Outpatient Prescriptions  Medication Sig Dispense Refill  . albuterol (PROVENTIL HFA;VENTOLIN HFA) 108 (90 BASE) MCG/ACT inhaler Inhale 2 puffs into the lungs every 6 (six) hours as needed. For shortness of breath.      . budesonide-formoterol (SYMBICORT) 160-4.5 MCG/ACT inhaler Inhale 2 puffs into the lungs 2 (two) times daily.      . cyanocobalamin (,VITAMIN B-12,) 1000 MCG/ML injection Inject into the muscle every 30 (thirty) days. Administers on the 15th of each month.      . emollient (BIAFINE) cream Apply 1 application topically daily. Apply to affected areas daily after rad tx      .  Hydrocodone-Acetaminophen (VICODIN HP) 10-300 MG TABS Take 1-2 tablets by mouth daily as needed. For pain.      Marland Kitchen lidocaine-prilocaine (EMLA) cream Apply topically as needed. Apply to port 1hr before chemo  30 g  0  . LORazepam (ATIVAN) 1 MG tablet Take 1 mg by mouth 3 (three) times daily.      . phenytoin (DILANTIN) 100 MG ER capsule Take 200 mg by mouth 2 (two) times daily.      . phenytoin (DILANTIN) 30 MG ER capsule Take 60 mg by mouth 2 (two) times daily.      . prochlorperazine (COMPAZINE) 10 MG tablet Take 10 mg by mouth every 6 (six) hours as needed. For chemo-related nausea.      . promethazine (PHENERGAN) 25 MG tablet Take 25 mg by mouth daily as needed. For nausea.      Marland Kitchen tiotropium (SPIRIVA) 18 MCG inhalation capsule Place 18 mcg into inhaler and inhale daily.      Marland Kitchen warfarin (COUMADIN) 5 MG tablet TAKE 1 TABLET BY MOUTH ONCE A DAY BETWEEN 4-6PM OR AS INSTRUCTED BYPHYSICIAN  50 tablet  1    Physical Findings: The patient is in no acute distress. Patient is alert and oriented.  weight is 142 lb 8 oz (64.638 kg). His oral temperature is 97.9 F (36.6 C). His blood pressure is 122/73 and his pulse  is 99. His respiration is 20. Marland Kitchen No chest wall tenderness, lungs CTA.  No significant changes.  Lab Findings: Lab Results  Component Value Date   WBC 6.2 12/12/2012   HGB 14.5 12/12/2012   HCT 42.6 12/12/2012   MCV 96.6 12/12/2012   PLT 241 12/12/2012   Radiographic Findings: Ct Chest W Contrast  12/12/2012  *RADIOLOGY REPORT*  Clinical Data:  Restaging lung cancer.  Chemotherapy and radiation therapy complete.  Right-sided chest pain.  CT CHEST, ABDOMEN AND PELVIS WITH CONTRAST  Technique:  Multidetector CT imaging of the chest, abdomen and pelvis was performed following the standard protocol during bolus administration of intravenous contrast.  Contrast: OMNIPAQUE IOHEXOL 300 MG/ML  SOLN  Comparison:  CT 09/09/2012  CT CHEST  Findings:  No axillary or supraclavicular  lymphadenopathy.  No mediastinal or hilar lymphadenopathy.  Small paratracheal lymph nodes are stable compared to prior.  No pericardial fluid.  Review of the lung parenchyma demonstrates decrease in size of right perihilar mass which measures 2.8 x 2.1 cm compared to 3.6 x 3.2 cm on the most recent comparison remeasured.  A small right upper lobe lesion measures 7 mm (image 19) unchanged from 7 mm on prior.  Linear nodularity in the left upper lobe (image 27) is unchanged.  No new pulmonary nodules.  IMPRESSION:  1.  Interval decrease in size of dominant right hilar mass. 2.  Stable bilateral pulmonary nodules.  CT ABDOMEN AND PELVIS  Findings:  No focal hepatic lesion.  The pancreas, spleen, and adrenal glands are normal.  Enhancing round lesion extending  from  the medial aspect of the left  kidney measures 2.9 x 3.1 cm compared to  2.6 x 2.9  cm for slight increase in size.  There multiple additional nonenhancing bilateral renal cysts.  The stomach, small bowel, and colon are unchanged.  There is a left lower quadrant colostomy.  Rectal stump is unchanged.  No retroperitoneal or mesenteric lymphadenopathy.  No peritoneal disease.  In the pelvis, bladder prostate gland are normal.  No pelvic lymphadenopathy.  IMPRESSION:  1.  No evidence of metastasis in the abdomen or pelvis.  2.  Interval mild enlargement of enhancing left renal mass concerning for primary renal cell carcinoma.   Original Report Authenticated By: Genevive Bi, M.D.    Ct Abdomen Pelvis W Contrast  12/12/2012  *RADIOLOGY REPORT*  Clinical Data:  Restaging lung cancer.  Chemotherapy and radiation therapy complete.  Right-sided chest pain.  CT CHEST, ABDOMEN AND PELVIS WITH CONTRAST  Technique:  Multidetector CT imaging of the chest, abdomen and pelvis was performed following the standard protocol during bolus administration of intravenous contrast.  Contrast: OMNIPAQUE IOHEXOL 300 MG/ML  SOLN  Comparison:  CT 09/09/2012  CT CHEST   Findings:  No axillary or supraclavicular lymphadenopathy.  No mediastinal or hilar lymphadenopathy.  Small paratracheal lymph nodes are stable compared to prior.  No pericardial fluid.  Review of the lung parenchyma demonstrates decrease in size of right perihilar mass which measures 2.8 x 2.1 cm compared to 3.6 x 3.2 cm on the most recent comparison remeasured.  A small right upper lobe lesion measures 7 mm (image 19) unchanged from 7 mm on prior.  Linear nodularity in the left upper lobe (image 27) is unchanged.  No new pulmonary nodules.  IMPRESSION:  1.  Interval decrease in size of dominant right hilar mass. 2.  Stable bilateral pulmonary nodules.  CT ABDOMEN AND PELVIS  Findings:  No focal hepatic  lesion.  The pancreas, spleen, and adrenal glands are normal.  Enhancing round lesion extending  from  the medial aspect of the left  kidney measures 2.9 x 3.1 cm compared to  2.6 x 2.9  cm for slight increase in size.  There multiple additional nonenhancing bilateral renal cysts.  The stomach, small bowel, and colon are unchanged.  There is a left lower quadrant colostomy.  Rectal stump is unchanged.  No retroperitoneal or mesenteric lymphadenopathy.  No peritoneal disease.  In the pelvis, bladder prostate gland are normal.  No pelvic lymphadenopathy.  IMPRESSION:  1.  No evidence of metastasis in the abdomen or pelvis.  2.  Interval mild enlargement of enhancing left renal mass concerning for primary renal cell carcinoma.   Original Report Authenticated By: Genevive Bi, M.D.    Impression:  The patient is recovering from the effects of radiation.  He does have some chest discomfort, but, CT shows improved tumor and no overt bone mets.  Plan:  See Dr. Arbutus Ped later today.  Try 2 Advil BID for chest wall inflammation, if no contraindications.  _____________________________________  Artist Pais Kathrynn Running, M.D.

## 2012-12-23 ENCOUNTER — Other Ambulatory Visit (HOSPITAL_BASED_OUTPATIENT_CLINIC_OR_DEPARTMENT_OTHER): Payer: Medicare HMO

## 2012-12-23 ENCOUNTER — Ambulatory Visit (HOSPITAL_BASED_OUTPATIENT_CLINIC_OR_DEPARTMENT_OTHER): Payer: Medicare HMO | Admitting: Pharmacist

## 2012-12-23 DIAGNOSIS — I82409 Acute embolism and thrombosis of unspecified deep veins of unspecified lower extremity: Secondary | ICD-10-CM

## 2012-12-23 DIAGNOSIS — C3490 Malignant neoplasm of unspecified part of unspecified bronchus or lung: Secondary | ICD-10-CM

## 2012-12-23 DIAGNOSIS — N2889 Other specified disorders of kidney and ureter: Secondary | ICD-10-CM

## 2012-12-23 DIAGNOSIS — Z86718 Personal history of other venous thrombosis and embolism: Secondary | ICD-10-CM

## 2012-12-23 LAB — COMPREHENSIVE METABOLIC PANEL (CC13)
ALT: 6 U/L (ref 0–55)
AST: 13 U/L (ref 5–34)
Alkaline Phosphatase: 105 U/L (ref 40–150)
Creatinine: 1.5 mg/dL — ABNORMAL HIGH (ref 0.7–1.3)
Total Bilirubin: 0.2 mg/dL (ref 0.20–1.20)

## 2012-12-23 LAB — PROTIME-INR
INR: 3.5 (ref 2.00–3.50)
Protime: 42 Seconds — ABNORMAL HIGH (ref 10.6–13.4)

## 2012-12-23 LAB — POCT INR: INR: 3.5

## 2012-12-23 NOTE — Progress Notes (Signed)
Pt is slightly supratherapeutic and asymtomatic today at 3.5 on 5 mg daily.  We will hold coumadin today.  Resume on Sat at 5mg  daily with 2.5 mg on Wed and Fri.  Return 01/04/13 at 8:30 am for lab; 8:45 am for Coumadin clinic.

## 2012-12-23 NOTE — Patient Instructions (Signed)
Hold coumadin today.  Resume on Sat at 5mg  daily with 2.5 mg on Wed and Fri.  Return 01/04/13 at 8:30 am for lab; 8:45 am for Coumadin clinic

## 2013-01-03 ENCOUNTER — Telehealth: Payer: Self-pay | Admitting: Internal Medicine

## 2013-01-03 NOTE — Telephone Encounter (Signed)
called pt back as wife lm to r/s 1/22 appts due to weather and r/s to 1/23     anne

## 2013-01-04 ENCOUNTER — Ambulatory Visit: Payer: Medicare HMO

## 2013-01-04 ENCOUNTER — Other Ambulatory Visit: Payer: Medicare HMO | Admitting: Lab

## 2013-01-05 ENCOUNTER — Ambulatory Visit (HOSPITAL_BASED_OUTPATIENT_CLINIC_OR_DEPARTMENT_OTHER): Payer: Medicare HMO | Admitting: Pharmacist

## 2013-01-05 ENCOUNTER — Other Ambulatory Visit: Payer: Medicare HMO | Admitting: Lab

## 2013-01-05 DIAGNOSIS — I82409 Acute embolism and thrombosis of unspecified deep veins of unspecified lower extremity: Secondary | ICD-10-CM

## 2013-01-05 DIAGNOSIS — Z86718 Personal history of other venous thrombosis and embolism: Secondary | ICD-10-CM

## 2013-01-05 LAB — POCT INR: INR: 2.7

## 2013-01-05 LAB — PROTIME-INR

## 2013-01-05 NOTE — Patient Instructions (Addendum)
Continue taking 5mg  daily except 2.5mg  on Tuesdays and Fridays. Recheck INR in 2 weeks on 01/16/13; Lab at 2:30pm and coumadin clinic at 2:45pm.

## 2013-01-05 NOTE — Progress Notes (Signed)
Continue taking 5mg  daily except 2.5mg  on Tuesdays and Fridays. Recheck INR in 2 weeks on 01/16/13; Lab at 2:30pm and coumadin clinic at 2:45pm. Pt requests 01/16/13 secondary to transportation reasons. He will be in [place] that day.

## 2013-01-11 ENCOUNTER — Other Ambulatory Visit: Payer: Self-pay | Admitting: Physician Assistant

## 2013-01-16 ENCOUNTER — Ambulatory Visit (HOSPITAL_BASED_OUTPATIENT_CLINIC_OR_DEPARTMENT_OTHER): Payer: Medicare HMO | Admitting: Pharmacist

## 2013-01-16 ENCOUNTER — Other Ambulatory Visit (HOSPITAL_BASED_OUTPATIENT_CLINIC_OR_DEPARTMENT_OTHER): Payer: Medicare HMO

## 2013-01-16 DIAGNOSIS — I82409 Acute embolism and thrombosis of unspecified deep veins of unspecified lower extremity: Secondary | ICD-10-CM

## 2013-01-16 DIAGNOSIS — Z86718 Personal history of other venous thrombosis and embolism: Secondary | ICD-10-CM

## 2013-01-16 LAB — PROTIME-INR: INR: 3.1 (ref 2.00–3.50)

## 2013-01-16 NOTE — Progress Notes (Signed)
INR at goal with dose decrease to 2.5mg  Tu/Fri and 5mg  other days.  No changes to report per pt.  Last day of Coumadin is 02/17/13.  Will check PT/INR one more time prior to this on 02/07/13.

## 2013-02-07 ENCOUNTER — Ambulatory Visit (HOSPITAL_BASED_OUTPATIENT_CLINIC_OR_DEPARTMENT_OTHER): Payer: Medicare HMO | Admitting: Pharmacist

## 2013-02-07 ENCOUNTER — Other Ambulatory Visit (HOSPITAL_BASED_OUTPATIENT_CLINIC_OR_DEPARTMENT_OTHER): Payer: Medicare HMO | Admitting: Lab

## 2013-02-07 DIAGNOSIS — I82409 Acute embolism and thrombosis of unspecified deep veins of unspecified lower extremity: Secondary | ICD-10-CM

## 2013-02-07 DIAGNOSIS — Z86718 Personal history of other venous thrombosis and embolism: Secondary | ICD-10-CM

## 2013-02-07 LAB — PROTIME-INR

## 2013-02-07 NOTE — Progress Notes (Signed)
INR supratherapeutic today (3.7) on 5mg  daily except 2.5mg  on TF. No changes.  No complaints.  Will have pt hold 1 dose of Coumadin today, then resume usual dose until 02/17/13.  Pt can discontinue Coumadin at that time since his therapy will be complete.  Pt will be discharged from the Coumadin Clinic.  Please reconsult as needed.

## 2013-02-17 ENCOUNTER — Telehealth: Payer: Self-pay | Admitting: *Deleted

## 2013-02-17 NOTE — Telephone Encounter (Signed)
PT. HAS BEEN SICK FOR ONE WEEK. THE PAST TWO DAYS HIS TEMPERATURE HAS BEEN 101.5. TODAY PT.'S TEMPERATURE IS 100.5. HE IS HAVING A CONSTANT STABBING PAIN FROM THE FRONT OF HIS CHEST THROUGH TO HIS BACK AT A SCALE OF EIGHT. PT. IS TAKING TWO TO THREE PAIN PILLS A DAY WITH LITTLE RELIEF. HE IS NOT EATING AND FLUID INTAKE THE PAST 24 HOURS HAS BEEN 24 OUNCES. VERBAL ORDER AND READ BACK TO ADRENA JOHNSON,PA- PT. MAY TAKE HIS PAIN MEDICATION ONE TABLET EVERY SIX HOURS. SINCE PT. IS ON OBSERVATION HE NEEDS TO CONTACT HIS PRIMARY CARE PHYSICIAN FOR TREATMENT OF HIS FEVER AND CONGESTION. NOTIFIED PT.'S WIFE OF THE ABOVE ORDERS. ALSO INSTRUCTED PT.'S WIFE IN GIVING PT. SMALL FREQUENT MEALS, FORCING FLUIDS, AND CONTACTING PRIMARY CARE PHYSICIAN. ALSO INFORMED PT.'S WIFE IF PT. BECOMES SHORT OF BREATH, HAS BLOOD IN HIS SPUTUM, FEVER INCREASES, OR CONDITION WORSENS HE NEEDS TO GO TO THE EMERGENCY ROOM. SHE VOICES UNDERSTANDING.

## 2013-03-02 ENCOUNTER — Inpatient Hospital Stay (HOSPITAL_COMMUNITY): Payer: Medicare HMO

## 2013-03-02 ENCOUNTER — Inpatient Hospital Stay (HOSPITAL_COMMUNITY)
Admission: AD | Admit: 2013-03-02 | Discharge: 2013-03-07 | DRG: 871 | Disposition: A | Discharge status: Home / Self Care | Payer: Medicare HMO | Source: Ambulatory Visit | Attending: Internal Medicine | Admitting: Internal Medicine

## 2013-03-02 ENCOUNTER — Encounter (HOSPITAL_COMMUNITY): Payer: Self-pay | Admitting: General Practice

## 2013-03-02 DIAGNOSIS — A419 Sepsis, unspecified organism: Principal | ICD-10-CM | POA: Diagnosis present

## 2013-03-02 DIAGNOSIS — D638 Anemia in other chronic diseases classified elsewhere: Secondary | ICD-10-CM | POA: Diagnosis present

## 2013-03-02 DIAGNOSIS — R059 Cough, unspecified: Secondary | ICD-10-CM

## 2013-03-02 DIAGNOSIS — M129 Arthropathy, unspecified: Secondary | ICD-10-CM | POA: Diagnosis present

## 2013-03-02 DIAGNOSIS — Z86711 Personal history of pulmonary embolism: Secondary | ICD-10-CM

## 2013-03-02 DIAGNOSIS — J189 Pneumonia, unspecified organism: Secondary | ICD-10-CM

## 2013-03-02 DIAGNOSIS — C3410 Malignant neoplasm of upper lobe, unspecified bronchus or lung: Secondary | ICD-10-CM | POA: Diagnosis present

## 2013-03-02 DIAGNOSIS — C3490 Malignant neoplasm of unspecified part of unspecified bronchus or lung: Secondary | ICD-10-CM | POA: Diagnosis present

## 2013-03-02 DIAGNOSIS — R05 Cough: Secondary | ICD-10-CM

## 2013-03-02 DIAGNOSIS — Z7901 Long term (current) use of anticoagulants: Secondary | ICD-10-CM

## 2013-03-02 DIAGNOSIS — R634 Abnormal weight loss: Secondary | ICD-10-CM

## 2013-03-02 DIAGNOSIS — G40909 Epilepsy, unspecified, not intractable, without status epilepticus: Secondary | ICD-10-CM

## 2013-03-02 DIAGNOSIS — F17200 Nicotine dependence, unspecified, uncomplicated: Secondary | ICD-10-CM | POA: Diagnosis present

## 2013-03-02 DIAGNOSIS — Z8719 Personal history of other diseases of the digestive system: Secondary | ICD-10-CM

## 2013-03-02 DIAGNOSIS — Z79899 Other long term (current) drug therapy: Secondary | ICD-10-CM

## 2013-03-02 DIAGNOSIS — Z923 Personal history of irradiation: Secondary | ICD-10-CM

## 2013-03-02 DIAGNOSIS — Z86718 Personal history of other venous thrombosis and embolism: Secondary | ICD-10-CM

## 2013-03-02 DIAGNOSIS — R569 Unspecified convulsions: Secondary | ICD-10-CM | POA: Diagnosis present

## 2013-03-02 DIAGNOSIS — J441 Chronic obstructive pulmonary disease with (acute) exacerbation: Secondary | ICD-10-CM | POA: Diagnosis present

## 2013-03-02 DIAGNOSIS — R627 Adult failure to thrive: Secondary | ICD-10-CM | POA: Diagnosis present

## 2013-03-02 DIAGNOSIS — J449 Chronic obstructive pulmonary disease, unspecified: Secondary | ICD-10-CM

## 2013-03-02 DIAGNOSIS — Z9221 Personal history of antineoplastic chemotherapy: Secondary | ICD-10-CM

## 2013-03-02 DIAGNOSIS — C3491 Malignant neoplasm of unspecified part of right bronchus or lung: Secondary | ICD-10-CM

## 2013-03-02 HISTORY — DX: Pneumonia, unspecified organism: J18.9

## 2013-03-02 HISTORY — DX: Shortness of breath: R06.02

## 2013-03-02 LAB — COMPREHENSIVE METABOLIC PANEL
Albumin: 2.3 g/dL — ABNORMAL LOW (ref 3.5–5.2)
Alkaline Phosphatase: 103 U/L (ref 39–117)
BUN: 16 mg/dL (ref 6–23)
CO2: 31 mEq/L (ref 19–32)
Chloride: 93 mEq/L — ABNORMAL LOW (ref 96–112)
GFR calc Af Amer: 68 mL/min — ABNORMAL LOW (ref >90)
GFR calc non Af Amer: 59 mL/min — ABNORMAL LOW (ref >90)
Glucose, Bld: 93 mg/dL (ref 70–99)
Potassium: 3.8 mEq/L (ref 3.5–5.1)
Total Bilirubin: 0.5 mg/dL (ref 0.3–1.2)

## 2013-03-02 LAB — CBC WITH DIFFERENTIAL/PLATELET
HCT: 28.1 % — ABNORMAL LOW (ref 39.0–52.0)
Hemoglobin: 9.4 g/dL — ABNORMAL LOW (ref 13.0–17.0)
Lymphocytes Relative: 10 % — ABNORMAL LOW (ref 12–46)
Lymphs Abs: 0.9 10*3/uL (ref 0.7–4.0)
Monocytes Relative: 10 % (ref 3–12)
Neutro Abs: 7.1 10*3/uL (ref 1.7–7.7)
Neutrophils Relative %: 79 % — ABNORMAL HIGH (ref 43–77)
RBC: 3.18 MIL/uL — ABNORMAL LOW (ref 4.22–5.81)

## 2013-03-02 LAB — URINALYSIS, ROUTINE W REFLEX MICROSCOPIC
Glucose, UA: NEGATIVE mg/dL
Hgb urine dipstick: NEGATIVE
Ketones, ur: 15 mg/dL — AB
Protein, ur: 30 mg/dL — AB

## 2013-03-02 LAB — URINE MICROSCOPIC-ADD ON

## 2013-03-02 LAB — PROTIME-INR: Prothrombin Time: 18.2 seconds — ABNORMAL HIGH (ref 11.6–15.2)

## 2013-03-02 LAB — HIV ANTIBODY (ROUTINE TESTING W REFLEX): HIV: NONREACTIVE

## 2013-03-02 MED ORDER — SODIUM CHLORIDE 0.9 % IV BOLUS (SEPSIS)
1000.0000 mL | Freq: Once | INTRAVENOUS | Status: AC
  Administered 2013-03-02: 1000 mL via INTRAVENOUS

## 2013-03-02 MED ORDER — ALBUTEROL SULFATE HFA 108 (90 BASE) MCG/ACT IN AERS
2.0000 | INHALATION_SPRAY | Freq: Four times a day (QID) | RESPIRATORY_TRACT | Status: DC | PRN
  Filled 2013-03-02: qty 6.7

## 2013-03-02 MED ORDER — METHYLPREDNISOLONE SODIUM SUCC 125 MG IJ SOLR
125.0000 mg | Freq: Four times a day (QID) | INTRAMUSCULAR | Status: DC
  Administered 2013-03-02 – 2013-03-03 (×4): 125 mg via INTRAVENOUS
  Filled 2013-03-02 (×8): qty 2

## 2013-03-02 MED ORDER — ALBUTEROL SULFATE (5 MG/ML) 0.5% IN NEBU
2.5000 mg | INHALATION_SOLUTION | RESPIRATORY_TRACT | Status: DC
  Administered 2013-03-02 – 2013-03-06 (×27): 2.5 mg via RESPIRATORY_TRACT
  Filled 2013-03-02 (×26): qty 0.5

## 2013-03-02 MED ORDER — PHENYTOIN SODIUM EXTENDED 30 MG PO CAPS
30.0000 mg | ORAL_CAPSULE | Freq: Two times a day (BID) | ORAL | Status: DC
Start: 2013-03-02 — End: 2013-03-07
  Administered 2013-03-02 – 2013-03-06 (×10): 30 mg via ORAL
  Filled 2013-03-02 (×12): qty 1

## 2013-03-02 MED ORDER — DEXTROSE 5 % IV SOLN
1.0000 g | Freq: Three times a day (TID) | INTRAVENOUS | Status: DC
  Administered 2013-03-02 – 2013-03-05 (×10): 1 g via INTRAVENOUS
  Filled 2013-03-02 (×12): qty 1

## 2013-03-02 MED ORDER — BUDESONIDE-FORMOTEROL FUMARATE 160-4.5 MCG/ACT IN AERO
2.0000 | INHALATION_SPRAY | Freq: Two times a day (BID) | RESPIRATORY_TRACT | Status: DC
  Administered 2013-03-02 – 2013-03-06 (×9): 2 via RESPIRATORY_TRACT
  Filled 2013-03-02 (×2): qty 6

## 2013-03-02 MED ORDER — SODIUM CHLORIDE 0.9 % IV BOLUS (SEPSIS)
500.0000 mL | Freq: Once | INTRAVENOUS | Status: AC
  Administered 2013-03-02: 500 mL via INTRAVENOUS

## 2013-03-02 MED ORDER — LORAZEPAM 1 MG PO TABS
1.0000 mg | ORAL_TABLET | Freq: Three times a day (TID) | ORAL | Status: DC
  Administered 2013-03-02 – 2013-03-06 (×15): 1 mg via ORAL
  Filled 2013-03-02 (×15): qty 1

## 2013-03-02 MED ORDER — TIOTROPIUM BROMIDE MONOHYDRATE 18 MCG IN CAPS
18.0000 ug | ORAL_CAPSULE | Freq: Every day | RESPIRATORY_TRACT | Status: DC
  Filled 2013-03-02: qty 5

## 2013-03-02 MED ORDER — HEPARIN SODIUM (PORCINE) 5000 UNIT/ML IJ SOLN
5000.0000 [IU] | Freq: Three times a day (TID) | INTRAMUSCULAR | Status: DC
  Administered 2013-03-02 – 2013-03-07 (×15): 5000 [IU] via SUBCUTANEOUS
  Filled 2013-03-02 (×18): qty 1

## 2013-03-02 MED ORDER — VANCOMYCIN HCL 500 MG IV SOLR
500.0000 mg | Freq: Two times a day (BID) | INTRAVENOUS | Status: DC
  Filled 2013-03-02 (×2): qty 500

## 2013-03-02 MED ORDER — ENSURE COMPLETE PO LIQD: 237.0000 mL | Freq: Three times a day (TID) | ORAL | Status: DC | PRN

## 2013-03-02 MED ORDER — SODIUM CHLORIDE 0.9 % IV SOLN
INTRAVENOUS | Status: DC
  Administered 2013-03-02 – 2013-03-05 (×5): via INTRAVENOUS

## 2013-03-02 MED ORDER — BIOTENE DRY MOUTH MT LIQD
15.0000 mL | Freq: Two times a day (BID) | OROMUCOSAL | Status: DC
  Administered 2013-03-03 – 2013-03-07 (×8): 15 mL via OROMUCOSAL

## 2013-03-02 MED ORDER — IPRATROPIUM BROMIDE 0.02 % IN SOLN
0.5000 mg | RESPIRATORY_TRACT | Status: DC
  Administered 2013-03-02 – 2013-03-06 (×27): 0.5 mg via RESPIRATORY_TRACT
  Filled 2013-03-02 (×26): qty 2.5

## 2013-03-02 MED ORDER — PHENYTOIN SODIUM EXTENDED 100 MG PO CAPS
200.0000 mg | ORAL_CAPSULE | Freq: Two times a day (BID) | ORAL | Status: DC
  Administered 2013-03-02 – 2013-03-06 (×10): 200 mg via ORAL
  Filled 2013-03-02 (×12): qty 2

## 2013-03-02 MED ORDER — PROMETHAZINE HCL 25 MG PO TABS
12.5000 mg | ORAL_TABLET | Freq: Four times a day (QID) | ORAL | Status: DC | PRN
  Administered 2013-03-02 – 2013-03-07 (×11): 12.5 mg via ORAL
  Filled 2013-03-02 (×11): qty 1

## 2013-03-02 MED ORDER — NICOTINE 14 MG/24HR TD PT24
14.0000 mg | MEDICATED_PATCH | Freq: Every day | TRANSDERMAL | Status: DC
  Administered 2013-03-02 – 2013-03-06 (×5): 14 mg via TRANSDERMAL
  Filled 2013-03-02 (×6): qty 1

## 2013-03-02 MED ORDER — HYDROCODONE-ACETAMINOPHEN 5-325 MG PO TABS
1.0000 | ORAL_TABLET | Freq: Four times a day (QID) | ORAL | Status: DC | PRN
  Administered 2013-03-02 – 2013-03-05 (×11): 1 via ORAL
  Filled 2013-03-02 (×11): qty 1

## 2013-03-02 MED ORDER — GUAIFENESIN ER 600 MG PO TB12
600.0000 mg | ORAL_TABLET | Freq: Two times a day (BID) | ORAL | Status: DC
  Administered 2013-03-02 – 2013-03-06 (×10): 600 mg via ORAL
  Filled 2013-03-02 (×12): qty 1

## 2013-03-02 MED ORDER — VANCOMYCIN HCL 500 MG IV SOLR
500.0000 mg | Freq: Two times a day (BID) | INTRAVENOUS | Status: DC
  Administered 2013-03-02 – 2013-03-05 (×7): 500 mg via INTRAVENOUS
  Filled 2013-03-02 (×9): qty 500

## 2013-03-02 NOTE — Progress Notes (Signed)
Utilization review completed.  

## 2013-03-02 NOTE — Progress Notes (Signed)
INITIAL NUTRITION ASSESSMENT  DOCUMENTATION CODES Per approved criteria  -Severe malnutrition in the context of chronic illness -Underweight   INTERVENTION:  Ensure Complete PRN  Recommend appetite stimulant  Recommend diet liberalization   Recommend aggressive nutrition support if continued nausea and poor PO's; note that enteral nutrition is preferred route of nutrition support if gut is functioning  Pt at high risk for re-feeding syndrome, please monitor Mag, Phos, and K for at least 3 days; MD to replete prn  NUTRITION DIAGNOSIS: Unintentional weight loss related to ongoing severe nausea as evidenced by 9 pound weight loss (6% body weight) in 2 months  Goal: Intake to meet >/=90% estimated nutrition needs  Monitor:  Diet liberalization, PO's, weight trends, I/O's  Reason for Assessment: Consult: Poor PO intake  62 y.o. male  Admitting Dx: HCAP (healthcare-associated pneumonia)  ASSESSMENT: Pt admitted with suspected pneumonia. About a week ago on 02/21/2013 he had a chest x-ray which showed a dense consolidation throughout the inferior right upper lobe and superior segment of the right lower lobe with some milder consolidation noted in the right middle lobe. Pt with c/o ongoing n/v.  Pt is s/p chemotherapy and radiation with last cycle ending in November 2013.  Pt with hx of small cell lung CA who is being followed by the RD at the [facility]. Per [facility] RD note, pt's usual weight is 142 pounds and he was drinking Ensure QID at home.  Spoke with pt who confirms being followed by RD at the [facility] and that he had been drinking Ensure Complete QID PTA. He states that he stopped drinking Ensure in the past 1-2 weeks due to ongoing severe nausea. When asked if he would like Ensure to be ordered here, he stated that he feels nauseous and has "no taste buds." He reports that he does not want anything and refuses supplements at this time.  Pt reports that he has  lost weight recently due to poor appetite and that he has felt very weak.   Pt meets criteria for severe malnutrition in the context of chronic disease based on severe muscle and fat wasting and 9 pound weight loss (6% body weight) in 2 months.  Nutrition Focused Physical Exam:  Subcutaneous Fat:  Orbital Region: n/a Upper Arm Region: severe wasting Thoracic and Lumbar Region: n/a  Muscle:  Temple Region: mild/moderate wasting Clavicle Bone Region: severe wasting Clavicle and Acromion Bone Region: severe wasting Scapular Bone Region: severe wasting Dorsal Hand: n/a Patellar Region: n/a Anterior Thigh Region: severe wasting  Posterior Calf Region: severe wasting  Edema: none present  Recommend appetite stimulant to improve PO's. Recommend diet liberalization to encourage improved PO's. Recommend aggressive nutrition support if severe nausea and poor PO's continue. Recommend monitoring Mag, Phos, and K for at least 3 days as pt is at high risk for re-feeding syndrome given ongoing poor PO's  Height: Ht Readings from Last 1 Encounters:  03/02/13 5' 11.5" (1.816 m)    Weight: Wt Readings from Last 1 Encounters:  03/02/13 132 lb 15 oz (60.3 kg)    Ideal Body Weight: 175 lbs  % Ideal Body Weight: 75%  Wt Readings from Last 10 Encounters:  03/02/13 132 lb 15 oz (60.3 kg)  12/15/12 141 lb (63.957 kg)  12/15/12 142 lb 8 oz (64.638 kg)  10/20/12 145 lb 4.8 oz (65.908 kg)  10/14/12 144 lb 1.6 oz (65.363 kg)  09/19/12 142 lb 3.2 oz (64.501 kg)  09/12/12 145 lb (65.772 kg)  09/05/12 137 lb 3.2 oz (62.234 kg)  08/22/12 143 lb (64.864 kg)  08/01/12 144 lb (65.318 kg)    Usual Body Weight: 142 lbs  % Usual Body Weight: 93%  BMI:  Body mass index is 18.28 kg/(m^2).--Underweight  Estimated Nutritional Needs: Kcal: 1900-2100 Protein: 95-105 Fluid: 1.9-2.1 L/day  Skin: intact, no wounds noted  Diet Order: Cardiac  EDUCATION NEEDS: -No education needs identified at  this time  No intake or output data in the 24 hours ending 03/02/13 1410  Last BM: PTA  Labs:   Recent Labs Lab 03/02/13 1018  NA 136  K 3.8  CL 93*  CO2 31  BUN 16  CREATININE 1.28  CALCIUM 8.8  GLUCOSE 93    CBG (last 3)  No results found for this basename: GLUCAP,  in the last 72 hours  Scheduled Meds: . ipratropium  0.5 mg Nebulization Q4H   And  . albuterol  2.5 mg Nebulization Q4H  . budesonide-formoterol  2 puff Inhalation BID  . ceFEPime (MAXIPIME) IV  1 g Intravenous Q8H  . guaiFENesin  600 mg Oral BID  . heparin subcutaneous  5,000 Units Subcutaneous Q8H  . LORazepam  1 mg Oral TID  . methylPREDNISolone (SOLU-MEDROL) injection  125 mg Intravenous Q6H  . nicotine  14 mg Transdermal Daily  . phenytoin  200 mg Oral BID  . phenytoin  30 mg Oral BID  . vancomycin  500 mg Intravenous Q12H    Continuous Infusions: . sodium chloride 100 mL/hr at 03/02/13 1208    Past Medical History  Diagnosis Date  . Crohn's disease of colon   . Pulmonary emboli   . COPD (chronic obstructive pulmonary disease)   . Seizure disorder   . Kidney mass   . Arthritis   . Emphysema of lung   . Weight loss, unintentional   . Cough   . Chest pain   . Weakness   . Cancer 06/09/12    lung RUL  . Lung cancer 06/09/12    biopsy- RUL neuroendocrine carcinoma, small cell type  . Seizures     last one ~ 3 yrs ago  . Hx of radiation therapy 10/13/12 -10/26/12    central chest, brain-prophylacitc  . Shortness of breath   . Pneumonia 03/02/2013    Past Surgical History  Procedure Laterality Date  . Arm surgery      left  . Right knee      right knee, hx of injury  . Rectal surgery      x7, Crohn's disease  . Abdominal surgery      x3, Crohn's disease  . Video bronchoscopy  06/09/2012    Procedure: VIDEO BRONCHOSCOPY WITHOUT FLUORO;  Surgeon: Leslye Peer, MD;  Location: [facility];  Service: Cardiopulmonary;  Laterality: Bilateral;  . Portacath placement  07/05/2012     Procedure: INSERTION PORT-A-CATH;  Surgeon: Mariella Saa, MD;  Location: [facility] OR;  Service: General;  Laterality: N/A;  . Port-a-cath removal  08/24/2012    Procedure: MINOR REMOVAL PORT-A-CATH;  Surgeon: Mariella Saa, MD;  Location: [facility];  Service: General;  Laterality: Right;    Trenton Gammon Dietetic Intern # [phone]  I agree with the above information and made appropriate revisions in blue. Jarold Motto MS, RD, LDN Pager: [phone] After-hours pager: [phone]

## 2013-03-02 NOTE — H&P (Addendum)
[facility] History and Physical  Anthony [name] ZOX:[id number] DOB: [birth year] DOA: 03/02/2013  Referring physician: Dr. Valentina Lucks PCP: Darnelle Bos, MD  Specialists: none  Chief Complaint: PNA  HPI: Anthony [name] is a 62 y.o. 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 He has been feeling poorly over the last 2 weeks, 10 pound weight loss, decreased appetite, fevers chills chest pain, and difficulties breathing and a cough productive with yellow greenish sputum. About a week ago on 02/21/2013 he had a chest x-ray which showed a dense consolidation throughout the inferior right upper lobe and superior segment of the right lower lobe with some milder consolidation noted in the right middle lobe. It also showed the wrong opacity along the medial right hemidiaphragm, which is new. The left lung was clear. He was placed on levofloxacin daily, and he finished treatment today. He presented to Dr. Valentina Lucks in clinic today, continuing to complain of weakness, fever 100-101, and continues to be short of breath with minimal ambulation. He has a history of heavy tobacco abuse, and used to smoke 2-3 packs per day, and now he is down to one pack per day. He currently denies any diarrhea, he endorses nausea without vomiting.  Review of Systems: As per history of present illness, otherwise negative  Past Medical History  Diagnosis Date  . Crohn's disease of colon   . Pulmonary emboli   . COPD (chronic obstructive pulmonary disease)   . Seizure disorder   . Kidney mass   . Arthritis   . Emphysema of lung   . Weight loss, unintentional   . Cough   . Chest pain   . Weakness   . Cancer 06/09/12    lung RUL  . Lung cancer 06/09/12    biopsy- RUL neuroendocrine carcinoma, small  cell type  . Seizures     last one ~ 3 yrs ago  . Hx of radiation therapy 10/13/12 -10/26/12    central chest, brain-prophylacitc  . Shortness of breath   . Pneumonia 03/02/2013   Past Surgical History  Procedure Laterality Date  . Arm surgery      left  . Right knee      right knee, hx of injury  . Rectal surgery      x7, Crohn's disease  . Abdominal surgery      x3, Crohn's disease  . Video bronchoscopy  06/09/2012    Procedure: VIDEO BRONCHOSCOPY WITHOUT FLUORO;  Surgeon: Leslye Peer, MD;  Location: [facility];  Service: Cardiopulmonary;  Laterality: Bilateral;  . Portacath placement  07/05/2012    Procedure: INSERTION PORT-A-CATH;  Surgeon: Mariella Saa, MD;  Location: [facility] OR;  Service: General;  Laterality: N/A;  . Port-a-cath removal  08/24/2012    Procedure: MINOR REMOVAL PORT-A-CATH;  Surgeon: Mariella Saa, MD;  Location: [facility];  Service: General;  Laterality: Right;   Social History:  reports that he has been smoking Cigarettes.  He has a 52 pack-year smoking history. He has never used smokeless tobacco. He reports that he does not drink alcohol or use illicit drugs.  No Known Allergies  Family History  Problem Relation Age of Onset  . Emphysema Brother   . Cancer Brother     lung  . Emphysema Brother  Died with lung cancer-Jerry  . Liver cancer Brother     Died- Freddy  . Diabetes Brother   . Hypertension Brother   . Diabetes Sister   . Diabetes Sister   . Heart disease Father   . Heart disease Mother   . Heart disease Brother   . Heart disease Brother     valve reconstruction at young age    Prior to Admission medications   Medication Sig Start Date End Date Taking? Authorizing Provider  albuterol (PROVENTIL HFA;VENTOLIN HFA) 108 (90 BASE) MCG/ACT inhaler Inhale 2 puffs into the lungs every 6 (six) hours as needed. For shortness of breath.    Historical Provider, MD  budesonide-formoterol (SYMBICORT) 160-4.5 MCG/ACT  inhaler Inhale 2 puffs into the lungs 2 (two) times daily.    Historical Provider, MD  cyanocobalamin (,VITAMIN B-12,) 1000 MCG/ML injection Inject into the muscle every 30 (thirty) days. Administers on the 15th of each month.    Historical Provider, MD  emollient (BIAFINE) cream Apply 1 application topically daily. Apply to affected areas daily after rad tx 10/14/12   Oneita Hurt, MD  Hydrocodone-Acetaminophen (VICODIN HP) 10-300 MG TABS Take 1-2 tablets by mouth daily as needed. For pain.    Historical Provider, MD  LORazepam (ATIVAN) 1 MG tablet Take 1 mg by mouth 3 (three) times daily.    Historical Provider, MD  phenytoin (DILANTIN) 100 MG ER capsule Take 200 mg by mouth 2 (two) times daily.    Historical Provider, MD  phenytoin (DILANTIN) 30 MG ER capsule Take 30 mg by mouth 2 (two) times daily.     Historical Provider, MD  prochlorperazine (COMPAZINE) 10 MG tablet Take 10 mg by mouth every 6 (six) hours as needed. For chemo-related nausea.    Historical Provider, MD  promethazine (PHENERGAN) 25 MG tablet Take 25 mg by mouth daily as needed. For nausea.    Historical Provider, MD  tiotropium (SPIRIVA) 18 MCG inhalation capsule Place 18 mcg into inhaler and inhale daily.    Historical Provider, MD  warfarin (COUMADIN) 5 MG tablet TAKE 1 TABLET BY MOUTH ONCE A DAY BETWEEN 4-6PM OR AS INSTRUCTED BYPHYSICIAN 01/11/13   Conni Slipper, PA-C   Physical Exam: Filed Vitals:   03/02/13 0948  BP: 92/58  Pulse: 121  Temp: 99 F (37.2 C)  TempSrc: Oral  Resp: 16  Height: 5' 11.5" (1.816 m)  Weight: 60.3 kg (132 lb 15 oz)  SpO2: 90%     General:  NAD, ill-appearing Caucasian male  Eyes: PERRL, EOMI  ENT: moist oropharynx  Neck: supple, no JVD  Cardiovascular: RRR without MRG, tachycardia noted   Respiratory: Good air movement, significant wheezing throughout his lung fields, coarse breath sounds throughout.  Abdomen: soft, non tender to palpation, positive bowel sounds, no  guarding, no rebound  Skin: no rashes  Musculoskeletal: no peripheral edema  Psychiatric: normal mood and affect  Neurologic: CN 2-12 grossly intact, MS 5/5 in all 4  Labs on Admission:  Basic Metabolic Panel:  Recent Labs Lab 03/02/13 1018  NA 136  K 3.8  CL 93*  CO2 31  GLUCOSE 93  BUN 16  CREATININE 1.28  CALCIUM 8.8   Liver Function Tests:  Recent Labs Lab 03/02/13 1018  AST 22  ALT 8  ALKPHOS 103  BILITOT 0.5  PROT 6.6  ALBUMIN 2.3*   CBC:  Recent Labs Lab 03/02/13 1018  WBC 9.0  NEUTROABS 7.1  HGB 9.4*  HCT 28.1*  MCV 88.4  PLT 313   Radiological Exams on Admission: Dg Chest Port 1 View  03/02/2013  *RADIOLOGY REPORT*  Clinical Data: Follow up pneumonia.  PORTABLE CHEST - 1 VIEW  Comparison: 02/21/2013.  Findings: Persistent right upper lobe pneumonia.  No significant interval change.  The cardiac silhouette, mediastinal and hilar contours are stable.  The left lung remains clear.  IMPRESSION: Persistent right upper lobe pneumonia.   Original Report Authenticated By: Rudie Meyer, M.D.     EKG: Independently reviewed.  Assessment/Plan Principal Problem:   HCAP (healthcare-associated pneumonia) Active Problems:   Lung cancer   Cancer   Seizures   COPD (chronic obstructive pulmonary disease)   Sepsis   Sepsis due to right lung pneumonia - patient failed outpatient therapy with levofloxacin. Given the fact that he has a history of lung cancer, status post chemotherapy and radiation therapy, consider him immunosuppressed at this point. - has tachycardia, borderline hypotension, elevated lactic acid and a source. No WBC but he has malignancy and recent chemo.  - Start vancomycin and cefepime - Blood cultures, sputum cultures obtained; - Repeat chest x-ray here shows persistent RUL PNA - Borderline hypotensive, bolus 1 L and reassess, check lactic acid, IV fluids at 100 cc per hour maintenance.  COPD exacerbation  - Due to #1 - Inhalers, IV  steroids for now  Failure to thrive - nutrition consult  Status post VTE - Per patient had a cough associated with his port placement, was on Coumadin before and he discontinued that about 3 weeks ago. - New port was placed. - check INR  Lung cancer - Some evidence of recurrence on the chest x-ray, patient is not tachycardic and borderline hypotensive. Await improvement in his vital signs with consideration of a repeat CT scan when he is a bit more stable.  Seizure disorder - Continue home Dilantin  DVT Prophylaxis - heparin s.q.  Code Status: Partial code. Patient is okay with intubation if his respiratory status declines, however if his heart stops he does not want chest compressions.  Family Communication: Wife at bedside  Disposition Plan: To be determined  Time spent: 45 minutes  Breylen Agyeman M. Elvera Lennox, MD [facility] Pager [phone]  If 7PM-7AM, please contact night-coverage [email or web address] Password [facility] 03/02/2013, 12:24 PM

## 2013-03-02 NOTE — Progress Notes (Signed)
ANTIBIOTIC CONSULT NOTE - INITIAL  Pharmacy Consult for Vancomycin Indication:  suspected pneumonia  No Known Allergies  Patient Measurements: Height: 5' 11.5" (181.6 cm) Weight: 132 lb 15 oz (60.3 kg) IBW/kg (Calculated) : 76.45 Adjusted Body Weight: n/a  Vital Signs: Temp: 99 F (37.2 C) (03/20 0948) Temp src: Oral (03/20 0948) BP: 92/58 mmHg (03/20 0948) Pulse Rate: 121 (03/20 0948) Intake/Output from previous day:   Intake/Output from this shift:    Labs:  Recent Labs  03/02/13 1018  WBC 9.0  HGB 9.4*  PLT 313  CREATININE 1.28   Estimated Creatinine Clearance: 51.7 ml/min (by C-G formula based on Cr of 1.28). No results found for this basename: VANCOTROUGH, VANCOPEAK, VANCORANDOM, GENTTROUGH, GENTPEAK, GENTRANDOM, TOBRATROUGH, TOBRAPEAK, TOBRARND, AMIKACINPEAK, AMIKACINTROU, AMIKACIN,  in the last 72 hours   Microbiology: No results found for this or any previous visit (from the past 720 hour(s)).  Medical History: Past Medical History  Diagnosis Date  . Crohn's disease of colon   . Pulmonary emboli   . COPD (chronic obstructive pulmonary disease)   . Seizure disorder   . Kidney mass   . Arthritis   . Emphysema of lung   . Weight loss, unintentional   . Cough   . Chest pain   . Weakness   . Cancer 06/09/12    lung RUL  . Lung cancer 06/09/12    biopsy- RUL neuroendocrine carcinoma, small cell type  . Seizures     last one ~ 3 yrs ago  . Hx of radiation therapy 10/13/12 -10/26/12    central chest, brain-prophylacitc  . Shortness of breath   . Pneumonia 03/02/2013    Medications:  Scheduled:  . ipratropium  0.5 mg Nebulization Q4H   And  . albuterol  2.5 mg Nebulization Q4H  . budesonide-formoterol  2 puff Inhalation BID  . ceFEPime (MAXIPIME) IV  1 g Intravenous Q8H  . guaiFENesin  600 mg Oral BID  . heparin subcutaneous  5,000 Units Subcutaneous Q8H  . LORazepam  1 mg Oral TID  . methylPREDNISolone (SOLU-MEDROL) injection  125 mg  Intravenous Q6H  . phenytoin  200 mg Oral BID  . phenytoin  30 mg Oral BID  . vancomycin  500 mg Intravenous Q12H  . [DISCONTINUED] tiotropium  18 mcg Inhalation Daily  . [DISCONTINUED] vancomycin  500 mg Intravenous Q12H   Assessment: 62 yo male with known hx small cell lung cancer, admitted with suspected PNA.  Pharmacy asked to begin empiric vancomycin.  Also started on cefepime per MD.  Estimated CrCl ~ 50 ml/min.  Cultures pending.  Goal of Therapy:  Vancomycin trough level 15-20 mcg/ml  Plan:  1. Vancomycin 500 mg IV q 12 hrs. 2. F/U renal function, will check trough as needed. 3. F/U cultures, LOT.  Tad Moore, BCPS  Clinical Pharmacist Pager [phone]  03/02/2013 12:48 PM

## 2013-03-03 LAB — BASIC METABOLIC PANEL
BUN: 18 mg/dL (ref 6–23)
GFR calc non Af Amer: >90 mL/min (ref >90)
Glucose, Bld: 225 mg/dL — ABNORMAL HIGH (ref 70–99)
Potassium: 3.5 mEq/L (ref 3.5–5.1)

## 2013-03-03 LAB — LEGIONELLA ANTIGEN, URINE

## 2013-03-03 LAB — URINE CULTURE: Colony Count: NO GROWTH

## 2013-03-03 MED ORDER — METHYLPREDNISOLONE SODIUM SUCC 125 MG IJ SOLR
80.0000 mg | Freq: Three times a day (TID) | INTRAMUSCULAR | Status: DC
  Administered 2013-03-03 – 2013-03-05 (×6): 80 mg via INTRAVENOUS
  Filled 2013-03-03 (×6): qty 1.28

## 2013-03-03 MED ORDER — SODIUM CHLORIDE 0.9 % IV BOLUS (SEPSIS)
1000.0000 mL | Freq: Once | INTRAVENOUS | Status: AC
  Administered 2013-03-03: 1000 mL via INTRAVENOUS

## 2013-03-03 NOTE — Evaluation (Signed)
Physical Therapy Evaluation Patient Details Name: Anthony [name] MRN: [id number] DOB: [birth year] Today's Date: 03/03/2013 Time: 0865-7846 PT Time Calculation (min): 18 min  PT Assessment / Plan / Recommendation Clinical Impression  Patient is a 62 yo male admitted with HCAP with h/o lung CA.  Patient also with h/o rt. hip pain, along with current weakness is impacting functional mobility.  Will benefit from acute PT to maximize independence prior to return home with wife.  Do not anticipate PT needs at discharge.      PT Assessment  Patient needs continued PT services    Follow Up Recommendations  No PT follow up;Supervision - Intermittent    Does the patient have the potential to tolerate intense rehabilitation      Barriers to Discharge None      Equipment Recommendations  None recommended by PT    Recommendations for Other Services     Frequency Min 3X/week    Precautions / Restrictions Restrictions Weight Bearing Restrictions: No   Pertinent Vitals/Pain Pain 8/10 rt. Chest and 6/10 rt. Hip impacting mobility      Mobility  Bed Mobility Bed Mobility: Supine to Sit;Sitting - Scoot to Edge of Bed;Sit to Supine Supine to Sit: 7: Independent Sitting - Scoot to Edge of Bed: 7: Independent Sit to Supine: 7: Independent Details for Bed Mobility Assistance: No cues or assist needed. Transfers Transfers: Sit to Stand;Stand to Sit Sit to Stand: 5: Supervision;With upper extremity assist;From bed Stand to Sit: 5: Supervision;With upper extremity assist;To bed Details for Transfer Assistance: Verbal cues for hand placement.  Assist for balance/safety. Ambulation/Gait Ambulation/Gait Assistance: 5: Supervision Ambulation Distance (Feet): 42 Feet Assistive device: None Ambulation/Gait Assistance Details: Patient with decreased rt. hip extension.  To put rt. heel on floor, he flexes at hip and back.  Balance slightly decreased with gait due to weakness.  Encouraged use of RW  initially for safety. Gait Pattern: Step-through pattern;Decreased stance time - right;Decreased step length - left;Right flexed knee in stance;Trunk flexed (Decreased hip extension - right) Gait velocity: Slow gait speed    Exercises     PT Diagnosis: Difficulty walking;Abnormality of gait;Generalized weakness;Acute pain  PT Problem List: Decreased strength;Decreased range of motion;Decreased activity tolerance;Decreased balance;Decreased mobility;Decreased knowledge of use of DME;Cardiopulmonary status limiting activity;Pain PT Treatment Interventions: DME instruction;Gait training;Functional mobility training;Patient/family education   PT Goals Acute Rehab PT Goals PT Goal Formulation: With patient/family Time For Goal Achievement: 03/10/13 Potential to Achieve Goals: Good Pt will go Sit to Stand: with modified independence;with upper extremity assist PT Goal: Sit to Stand - Progress: Goal set today Pt will Ambulate: >150 feet;with modified independence;with least restrictive assistive device PT Goal: Ambulate - Progress: Goal set today  Visit Information  Last PT Received On: 03/03/13 Assistance Needed: +1    Subjective Data  Subjective: "My hip is bone-on-bone, and hurts all the time."  "I'm not going to have surgery on my hip now." Patient Stated Goal: To go home soon.   Prior Functioning  Home Living Lives With: Spouse;Son (daughter-in-law) Available Help at Discharge: Family;Available 24 hours/day Type of Home: Mobile home Home Access: Stairs to enter Entrance Stairs-Number of Steps: 2 Entrance Stairs-Rails: None Home Layout: One level Bathroom Shower/Tub: Engineer, manufacturing systems: Standard Home Adaptive Equipment: Walker - rolling Prior Function Level of Independence: Independent Able to Take Stairs?: Yes Driving: Yes Vocation: On disability Communication Communication: No difficulties    Cognition  Cognition Overall Cognitive Status: Appears within  functional limits for tasks assessed/performed  Arousal/Alertness: Awake/alert Orientation Level: Appears intact for tasks assessed Behavior During Session: [facility] for tasks performed    Extremity/Trunk Assessment Right Upper Extremity Assessment RUE ROM/Strength/Tone: WFL for tasks assessed RUE Sensation: WFL - Light Touch Left Upper Extremity Assessment LUE ROM/Strength/Tone: WFL for tasks assessed LUE Sensation: WFL - Light Touch Right Lower Extremity Assessment RLE ROM/Strength/Tone: Deficits;Unable to fully assess;Due to pain RLE ROM/Strength/Tone Deficits: Decreased ROM - hip extension.  Strength grossly 4-/5 RLE Sensation: WFL - Light Touch Left Lower Extremity Assessment LLE ROM/Strength/Tone: Deficits LLE ROM/Strength/Tone Deficits: Strength grossly 4/5 LLE Sensation: WFL - Light Touch   Balance    End of Session PT - End of Session Equipment Utilized During Treatment: Gait belt Activity Tolerance: Patient limited by fatigue;Patient limited by pain Patient left: in bed;with call bell/phone within reach;with family/visitor present Nurse Communication: Mobility status  GP     Vena Austria 03/03/2013, 3:08 PM Durenda Hurt. Renaldo Fiddler, [facility] Acute Rehab Services Pager [phone]

## 2013-03-03 NOTE — Progress Notes (Signed)
Subjective: Feeling better, still some chest pain  Objective: Vital signs in last 24 hours: Temp:  [97.7 F (36.5 C)-99 F (37.2 C)] 97.9 F (36.6 C) (03/21 0452) Pulse Rate:  [85-121] 120 (03/21 0452) Resp:  [16] 16 (03/21 0452) BP: (78-98)/(50-58) 98/54 mmHg (03/21 0452) SpO2:  [90 %-100 %] 99 % (03/21 0457) Weight:  [60.3 kg (132 lb 15 oz)] 60.3 kg (132 lb 15 oz) (03/20 0948) Weight change:  Last BM Date: 03/02/13  Intake/Output from previous day: 03/20 0701 - 03/21 0700 In: -  Out: 700 [Urine:700] Intake/Output this shift: Total I/O In: -  Out: 500 [Urine:500]  General appearance: alert and cooperative Resp: wheezes bilaterally Cardio: regular rate and rhythm, S1, S2 normal, no murmur, click, rub or gallop GI: soft, non-tender; bowel sounds normal; no masses,  no organomegaly Extremities: extremities normal, atraumatic, no cyanosis or edema  Lab Results:  Recent Labs  03/02/13 1018  WBC 9.0  HGB 9.4*  HCT 28.1*  PLT 313   BMET  Recent Labs  03/02/13 1018  NA 136  K 3.8  CL 93*  CO2 31  GLUCOSE 93  BUN 16  CREATININE 1.28  CALCIUM 8.8    Studies/Results: Dg Chest Port 1 View  03/02/2013  *RADIOLOGY REPORT*  Clinical Data: Follow up pneumonia.  PORTABLE CHEST - 1 VIEW  Comparison: 02/21/2013.  Findings: Persistent right upper lobe pneumonia.  No significant interval change.  The cardiac silhouette, mediastinal and hilar contours are stable.  The left lung remains clear.  IMPRESSION: Persistent right upper lobe pneumonia.   Original Report Authenticated By: Rudie Meyer, M.D.     Medications: I have reviewed the patient's current medications.  Assessment/Plan: Principal Problem:   HCAP (healthcare-associated pneumonia) strep urinary Antigen positive, broad spectrum IV antibiotics, f/u cultures,PT consult Active Problems:   COPD Exacerbation (chronic obstructive pulmonary disease) decrease IV steroids, continue bronchodilaters   Sepsis?Early, BP up  after IVFs, continue IVFs and antibiotics   Lung cancer delay CT chest for now, will inform oncology of his admission   Seizures dilantin   LOS: 1 day   Armani Brar [name] 03/03/2013, 6:18 AM

## 2013-03-04 ENCOUNTER — Other Ambulatory Visit: Payer: Self-pay | Admitting: Physician Assistant

## 2013-03-04 LAB — BASIC METABOLIC PANEL
Calcium: 8.2 mg/dL — ABNORMAL LOW (ref 8.4–10.5)
GFR calc Af Amer: >90 mL/min (ref >90)
GFR calc non Af Amer: >90 mL/min (ref >90)
Glucose, Bld: 149 mg/dL — ABNORMAL HIGH (ref 70–99)
Potassium: 3.9 mEq/L (ref 3.5–5.1)
Sodium: 138 mEq/L (ref 135–145)

## 2013-03-04 LAB — CBC WITH DIFFERENTIAL/PLATELET
Basophils Relative: 0 % (ref 0–1)
Eosinophils Absolute: 0 10*3/uL (ref 0.0–0.7)
Lymphs Abs: 0.5 10*3/uL — ABNORMAL LOW (ref 0.7–4.0)
MCH: 29.9 pg (ref 26.0–34.0)
Neutro Abs: 15.4 10*3/uL — ABNORMAL HIGH (ref 1.7–7.7)
Neutrophils Relative %: 94 % — ABNORMAL HIGH (ref 43–77)
Platelets: 304 10*3/uL (ref 150–400)
RBC: 2.51 MIL/uL — ABNORMAL LOW (ref 4.22–5.81)

## 2013-03-04 NOTE — Progress Notes (Signed)
Subjective: Patient still wheezing, coughing still some shortness of breath. Cough is nonproductive. Patient failed treatment of pneumonia outpatient with 10 days of Levaquin. He is now on broad-spectrum antibiotics showing signs of improvement. Strep pneumonia urinary antigen was positive., Rule out resistant organism as he failed outpatient treatment with Levaquin  Objective: Vital signs in last 24 hours: Temp:  [97.5 F (36.4 C)-97.8 F (36.6 C)] 97.8 F (36.6 C) (03/22 0900) Pulse Rate:  [94-103] 97 (03/22 0900) Resp:  [16-18] 16 (03/22 0900) BP: (84-97)/(50-56) 90/50 mmHg (03/22 0900) SpO2:  [93 %-97 %] 95 % (03/22 0900) Weight change:  Last BM Date: 03/03/13  Intake/Output from previous day: 03/21 0701 - 03/22 0700 In: 1010 [P.O.:360; I.V.:450; IV Piggyback:200] Out: 1850 [Urine:1850] Intake/Output this shift: Total I/O In: 120 [P.O.:120] Out: 250 [Urine:250]  Resp: Rhonchi right upper lobe, otherwise bilateral expiratory wheeze Cardio: regular rate and rhythm, S1, S2 normal, no murmur, click, rub or gallop Extremities: extremities normal, atraumatic, no cyanosis or edema  Lab Results:  Results for orders placed during the hospital encounter of 03/02/13 (from the past 24 hour(s))  BASIC METABOLIC PANEL     Status: Abnormal   Collection Time    03/04/13  5:50 AM      Result Value Range   Sodium 138  135 - 145 mEq/L   Potassium 3.9  3.5 - 5.1 mEq/L   Chloride 102  96 - 112 mEq/L   CO2 25  19 - 32 mEq/L   Glucose, Bld 149 (*) 70 - 99 mg/dL   BUN 17  6 - 23 mg/dL   Creatinine, Ser 4.09  0.50 - 1.35 mg/dL   Calcium 8.2 (*) 8.4 - 10.5 mg/dL   GFR calc non Af Amer >90  >90 mL/min   GFR calc Af Amer >90  >90 mL/min  CBC WITH DIFFERENTIAL     Status: Abnormal   Collection Time    03/04/13  5:50 AM      Result Value Range   WBC 16.4 (*) 4.0 - 10.5 K/uL   RBC 2.51 (*) 4.22 - 5.81 MIL/uL   Hemoglobin 7.5 (*) 13.0 - 17.0 g/dL   HCT 81.1 (*) 91.4 - 78.2 %   MCV 88.0   78.0 - 100.0 fL   MCH 29.9  26.0 - 34.0 pg   MCHC 33.9  30.0 - 36.0 g/dL   RDW 95.6  21.3 - 08.6 %   Platelets 304  150 - 400 K/uL   Neutrophils Relative 94 (*) 43 - 77 %   Neutro Abs 15.4 (*) 1.7 - 7.7 K/uL   Lymphocytes Relative 3 (*) 12 - 46 %   Lymphs Abs 0.5 (*) 0.7 - 4.0 K/uL   Monocytes Relative 3  3 - 12 %   Monocytes Absolute 0.4  0.1 - 1.0 K/uL   Eosinophils Relative 0  0 - 5 %   Eosinophils Absolute 0.0  0.0 - 0.7 K/uL   Basophils Relative 0  0 - 1 %   Basophils Absolute 0.0  0.0 - 0.1 K/uL      Studies/Results: Dg Chest Port 1 View  03/02/2013  *RADIOLOGY REPORT*  Clinical Data: Follow up pneumonia.  PORTABLE CHEST - 1 VIEW  Comparison: 02/21/2013.  Findings: Persistent right upper lobe pneumonia.  No significant interval change.  The cardiac silhouette, mediastinal and hilar contours are stable.  The left lung remains clear.  IMPRESSION: Persistent right upper lobe pneumonia.   Original Report Authenticated By: Rudie Meyer, M.D.  Medications:  Prior to Admission:  Prescriptions prior to admission  Medication Sig Dispense Refill  . albuterol (PROVENTIL HFA;VENTOLIN HFA) 108 (90 BASE) MCG/ACT inhaler Inhale 2 puffs into the lungs every 6 (six) hours as needed. For shortness of breath.      . budesonide-formoterol (SYMBICORT) 160-4.5 MCG/ACT inhaler Inhale 2 puffs into the lungs 2 (two) times daily.      . Hydrocodone-Acetaminophen (VICODIN HP) 10-300 MG TABS Take 1-2 tablets by mouth daily as needed. For pain.      Marland Kitchen LORazepam (ATIVAN) 1 MG tablet Take 1 mg by mouth 3 (three) times daily.      . phenytoin (DILANTIN) 100 MG ER capsule Take 200 mg by mouth daily. Takes with a 30mg  tablet to = 230mg       . phenytoin (DILANTIN) 30 MG ER capsule Take 30 mg by mouth daily. Takes with 200mg  to = 230mg  dose      . promethazine (PHENERGAN) 25 MG tablet Take 25 mg by mouth daily as needed. For nausea.      Marland Kitchen tiotropium (SPIRIVA) 18 MCG inhalation capsule Place 18 mcg into  inhaler and inhale daily.      . cyanocobalamin (,VITAMIN B-12,) 1000 MCG/ML injection Inject into the muscle every 30 (thirty) days. Administers on the 15th of each month.       Scheduled: . ipratropium  0.5 mg Nebulization Q4H   And  . albuterol  2.5 mg Nebulization Q4H  . antiseptic oral rinse  15 mL Mouth Rinse BID  . budesonide-formoterol  2 puff Inhalation BID  . ceFEPime (MAXIPIME) IV  1 g Intravenous Q8H  . guaiFENesin  600 mg Oral BID  . heparin subcutaneous  5,000 Units Subcutaneous Q8H  . LORazepam  1 mg Oral TID  . methylPREDNISolone (SOLU-MEDROL) injection  80 mg Intravenous Q8H  . nicotine  14 mg Transdermal Daily  . phenytoin  200 mg Oral BID  . phenytoin  30 mg Oral BID  . vancomycin  500 mg Intravenous Q12H   Continuous: . sodium chloride 150 mL/hr at 03/04/13 0309    Assessment/Plan: Principal Problem:   HCAP (healthcare-associated pneumonia) Active Problems:   Lung cancer   Seizures   COPD (chronic obstructive pulmonary disease)   Sepsis  Continue treatment as outlined   LOS: 2 days   Deziree Mokry D 03/04/2013, 9:50 AM

## 2013-03-05 LAB — CBC
Hemoglobin: 8.4 g/dL — ABNORMAL LOW (ref 13.0–17.0)
MCV: 90.9 fL (ref 78.0–100.0)
Platelets: 328 10*3/uL (ref 150–400)
RBC: 2.85 MIL/uL — ABNORMAL LOW (ref 4.22–5.81)
WBC: 10.8 10*3/uL — ABNORMAL HIGH (ref 4.0–10.5)

## 2013-03-05 LAB — EXPECTORATED SPUTUM ASSESSMENT W GRAM STAIN, RFLX TO RESP C

## 2013-03-05 LAB — OCCULT BLOOD X 1 CARD TO LAB, STOOL: Fecal Occult Bld: NEGATIVE

## 2013-03-05 MED ORDER — PREDNISONE 20 MG PO TABS
40.0000 mg | ORAL_TABLET | Freq: Every day | ORAL | Status: DC
  Administered 2013-03-06 – 2013-03-07 (×2): 40 mg via ORAL
  Filled 2013-03-05 (×3): qty 2

## 2013-03-05 MED ORDER — METHYLPREDNISOLONE SODIUM SUCC 40 MG IJ SOLR: 40.0000 mg | Freq: Two times a day (BID) | INTRAMUSCULAR | Status: DC

## 2013-03-05 MED ORDER — HYDROCODONE-ACETAMINOPHEN 5-325 MG PO TABS
1.0000 | ORAL_TABLET | ORAL | Status: DC | PRN
  Administered 2013-03-05 – 2013-03-07 (×8): 1 via ORAL
  Filled 2013-03-05 (×8): qty 1

## 2013-03-05 MED ORDER — LEVOFLOXACIN 500 MG PO TABS
500.0000 mg | ORAL_TABLET | Freq: Every day | ORAL | Status: DC
  Filled 2013-03-05 (×3): qty 1

## 2013-03-05 NOTE — Progress Notes (Signed)
Subjective: Patient requested increase in his pain medication to every 4 hours. He states his breathing is stable. He denies fever or chills. Labs yesterday showed low hemoglobin. He feels a little fatigued. He states he had a transfusion about a month ago. He denies any blood in his ostomy. His IV fluids were decreased yesterday. We will check followup hemoglobin today as transfusion may be indicated  Objective: Vital signs in last 24 hours: Temp:  [97 F (36.1 C)-97.6 F (36.4 C)] 97.6 F (36.4 C) (03/23 0450) Pulse Rate:  [86-97] 86 (03/23 0450) Resp:  [16-18] 17 (03/23 0450) BP: (95-112)/(52-63) 112/63 mmHg (03/23 0450) SpO2:  [93 %-99 %] 99 % (03/23 0933) Weight:  [65.1 kg (143 lb 8.3 oz)] 65.1 kg (143 lb 8.3 oz) (03/22 2224) Weight change:  Last BM Date: 03/03/13  Intake/Output from previous day: 03/22 0701 - 03/23 0700 In: 1530 [P.O.:480; I.V.:1050] Out: 750 [Urine:750] Intake/Output this shift:    General appearance: alert and cooperative Resp: Less expiratory wheeze, rhonchi right upper lobe Cardio: regular rate and rhythm Extremities: extremities normal, atraumatic, no cyanosis or edema  Lab Results:  No results found for this or any previous visit (from the past 24 hour(s)).    Studies/Results: No results found.  Medications:  Scheduled: . ipratropium  0.5 mg Nebulization Q4H   And  . albuterol  2.5 mg Nebulization Q4H  . antiseptic oral rinse  15 mL Mouth Rinse BID  . budesonide-formoterol  2 puff Inhalation BID  . ceFEPime (MAXIPIME) IV  1 g Intravenous Q8H  . guaiFENesin  600 mg Oral BID  . heparin subcutaneous  5,000 Units Subcutaneous Q8H  . LORazepam  1 mg Oral TID  . methylPREDNISolone (SOLU-MEDROL) injection  80 mg Intravenous Q8H  . nicotine  14 mg Transdermal Daily  . phenytoin  200 mg Oral BID  . phenytoin  30 mg Oral BID  . vancomycin  500 mg Intravenous Q12H   Continuous: . sodium chloride 10 mL/hr at 03/05/13 1001     Assessment/Plan: Principal Problem:   HCAP (healthcare-associated pneumonia) Active Problems:   Lung cancer   Seizures   COPD (chronic obstructive pulmonary disease)   Sepsis  anemia probably multifactorial in etiology, multiple blood draws, delusional effect from IV fluids. Check followup H&H, heme check stool, transfusion more than likely will be indicated.    LOS: 3 days   Grissel Tyrell D 03/05/2013, 10:07 AM

## 2013-03-05 NOTE — Progress Notes (Signed)
ANTIBIOTIC CONSULT NOTE - Follow UP Pharmacy Consult for Vancomycin Indication:  suspected pneumonia  No Known Allergies  Patient Measurements: Height: 5' 11.5" (181.6 cm) Weight: 143 lb 8.3 oz (65.1 kg) IBW/kg (Calculated) : 76.45 Adjusted Body Weight: n/a  Vital Signs: Temp: 97.9 F (36.6 C) (03/23 1100) Temp src: Oral (03/23 1100) BP: 111/57 mmHg (03/23 1100) Pulse Rate: 86 (03/23 1100) Intake/Output from previous day: 03/22 0701 - 03/23 0700 In: 1530 [P.O.:480; I.V.:1050] Out: 750 [Urine:750] Intake/Output from this shift:    Labs:  Recent Labs  03/03/13 0741 03/04/13 0550 03/05/13 0942  WBC  --  16.4* 10.8*  HGB  --  7.5* 8.4*  PLT  --  304 328  CREATININE 0.90 0.88  --    Estimated Creatinine Clearance: 81.2 ml/min (by C-G formula based on Cr of 0.88). No results found for this basename: VANCOTROUGH, VANCOPEAK, VANCORANDOM, GENTTROUGH, GENTPEAK, GENTRANDOM, TOBRATROUGH, TOBRAPEAK, TOBRARND, AMIKACINPEAK, AMIKACINTROU, AMIKACIN,  in the last 72 hours   Microbiology: Recent Results (from the past 720 hour(s))  CULTURE, BLOOD (ROUTINE X 2)     Status: None   Collection Time    03/02/13 10:00 AM      Result Value Range Status   Specimen Description BLOOD LEFT ANTECUBITAL   Final   Special Requests BOTTLES DRAWN AEROBIC ONLY 10CC   Final   Culture  Setup Time 03/02/2013 14:14   Final   Culture     Final   Value:        BLOOD CULTURE RECEIVED NO GROWTH TO DATE CULTURE WILL BE HELD FOR 5 DAYS BEFORE ISSUING A FINAL NEGATIVE REPORT   Report Status PENDING   Incomplete  CULTURE, BLOOD (ROUTINE X 2)     Status: None   Collection Time    03/02/13 10:15 AM      Result Value Range Status   Specimen Description BLOOD RIGHT ANTECUBITAL   Final   Special Requests BOTTLES DRAWN AEROBIC ONLY 10CC   Final   Culture  Setup Time 03/02/2013 14:15   Final   Culture     Final   Value:        BLOOD CULTURE RECEIVED NO GROWTH TO DATE CULTURE WILL BE HELD FOR 5 DAYS BEFORE  ISSUING A FINAL NEGATIVE REPORT   Report Status PENDING   Incomplete  URINE CULTURE     Status: None   Collection Time    03/02/13 12:46 PM      Result Value Range Status   Specimen Description URINE, RANDOM   Final   Special Requests NONE   Final   Culture  Setup Time 03/02/2013 Anthony:42   Final   Colony Count NO GROWTH   Final   Culture NO GROWTH   Final   Report Status 03/03/2013 FINAL   Final    Assessment: 62 yo Anthony [name] with known hx small cell lung cancer, admitted 03/02/2013   with suspected PNA.  Pharmacy asked to begin empiric vancomycin.  Also started on cefepime per MD.   ID HCAP, afeb, WBC trend down, 10.8 3/20 Vanc, Cefepime>> Urine NG Renal:  Estimated CrCl ~ 82 ml/min. SCr stable, UOP variable, less in last 24h.   Goal of Therapy:  Vancomycin trough level 15-20 mcg/ml  Plan:  1. Vancomycin 500 mg IV q 12 hrs, will check trough with am labs  2. Follow up SCr, UOP, cultures, clinical course and adjust as clinically indicated.    Thank you for allowing pharmacy to be a part of this  patients care team.  Lovenia Kim Pharm.D., BCPS Clinical Pharmacist 03/05/2013 11:47 AM Pager: [phone] Phone: [phone]

## 2013-03-06 ENCOUNTER — Other Ambulatory Visit: Payer: Self-pay | Admitting: Physician Assistant

## 2013-03-06 ENCOUNTER — Telehealth: Payer: Self-pay | Admitting: Internal Medicine

## 2013-03-06 ENCOUNTER — Other Ambulatory Visit: Payer: Self-pay | Admitting: Medical Oncology

## 2013-03-06 MED ORDER — ALBUTEROL SULFATE (5 MG/ML) 0.5% IN NEBU
2.5000 mg | INHALATION_SOLUTION | Freq: Four times a day (QID) | RESPIRATORY_TRACT | Status: DC
  Administered 2013-03-07 (×2): 2.5 mg via RESPIRATORY_TRACT
  Filled 2013-03-06 (×2): qty 0.5

## 2013-03-06 MED ORDER — IPRATROPIUM BROMIDE 0.02 % IN SOLN
0.5000 mg | Freq: Four times a day (QID) | RESPIRATORY_TRACT | Status: DC
  Administered 2013-03-07 (×2): 0.5 mg via RESPIRATORY_TRACT
  Filled 2013-03-06 (×2): qty 2.5

## 2013-03-06 MED ORDER — DEXTROSE 5 % IV SOLN
1.0000 g | Freq: Two times a day (BID) | INTRAVENOUS | Status: DC
  Administered 2013-03-06 (×2): 1 g via INTRAVENOUS
  Filled 2013-03-06 (×3): qty 1

## 2013-03-06 NOTE — Progress Notes (Signed)
Subjective: Feeling better3  Objective: Vital signs in last 24 hours: Temp:  [97.6 F (36.4 C)-98.6 F (37 C)] 98.5 F (36.9 C) (03/24 0519) Pulse Rate:  [64-111] 111 (03/24 0519) Resp:  [16-18] 17 (03/24 0519) BP: (93-111)/(42-60) 100/60 mmHg (03/24 0519) SpO2:  [97 %-100 %] 98 % (03/24 0519) Weight:  [61.6 kg (135 lb 12.9 oz)] 61.6 kg (135 lb 12.9 oz) (03/23 2259) Weight change: 1.5 kg (3 lb 4.9 oz) Last BM Date: 03/05/13  Intake/Output from previous day: 03/23 0701 - 03/24 0700 In: 700 [P.O.:120; I.V.:80; IV Piggyback:500] Out: -  Intake/Output this shift:    General appearance: alert and cooperative Resp: rhonchi bilaterally Cardio: regular rate and rhythm, S1, S2 normal, no murmur, click, rub or gallop Extremities: extremities normal, atraumatic, no cyanosis or edema  Lab Results:  Recent Labs  03/04/13 0550 03/05/13 0942  WBC 16.4* 10.8*  HGB 7.5* 8.4*  HCT 22.1* 25.9*  PLT 304 328   BMET  Recent Labs  03/03/13 0741 03/04/13 0550  NA 136 138  K 3.5 3.9  CL 100 102  CO2 27 25  GLUCOSE 225* 149*  BUN 18 17  CREATININE 0.90 0.88  CALCIUM 8.0* 8.2*    Studies/Results: No results found.  Medications: I have reviewed the patient's current medications.  Assessment/Plan: Principal Problem:  HCAP (healthcare-associated pneumonia) strep urinary Antigen positive, D/C vancomycin, continue cefepime, change to po antibiotics tomorrow and possible discharge, f/u cultures Active Problems:  COPD Exacerbation (chronic obstructive pulmonary disease) improved, change to po steroids, continue bronchodilaters  Sepsis?Early resolved Lung cancer delay CT chest for now, will inform oncology of his admission  Seizures dilantin   LOS: 4 days   Anthony [name] Anthony [name] 03/06/2013, 6:04 AM

## 2013-03-06 NOTE — Progress Notes (Addendum)
Physical Therapy Treatment Patient Details Name: Anthony [name] MRN: [id number] DOB: 1951-03-16 Today's Date: 03/06/2013 Time: 4098-1191 PT Time Calculation (min): 13 min  PT Assessment / Plan / Recommendation Comments on Treatment Session  Pt is improving in ability to maintain O2 sats > 90% with acitivity, but still is dyspneic on exetion.  He will benefit from short, frequent bouts of ROM, standing, short distance ambulation, core activation. Will issue home exercie program next visit    Follow Up Recommendations  No PT follow up;Supervision - Intermittent     Does the patient have the potential to tolerate intense rehabilitation     Barriers to Discharge        Equipment Recommendations  None recommended by PT    Recommendations for Other Services    Frequency     Plan Discharge plan remains appropriate;Frequency remains appropriate    Precautions / Restrictions Precautions Precaution Comments: pt dyspneic on exertion   Pertinent Vitals/Pain C/o intermittent chest pain from pna    Mobility  Bed Mobility Bed Mobility: Supine to Sit;Sitting - Scoot to Edge of Bed;Sit to Supine Supine to Sit: 7: Independent Sitting - Scoot to Edge of Bed: 7: Independent Sit to Supine: 7: Independent Details for Bed Mobility Assistance: No cues or assist needed. Transfers Transfers: Sit to Stand;Stand to Sit Sit to Stand: With upper extremity assist;From bed;7: Independent Stand to Sit: With upper extremity assist;To bed;7: Independent Ambulation/Gait Ambulation/Gait Assistance: 6: Modified independent (Device/Increase time) Ambulation Distance (Feet): 150 Feet (75'x2 with one standing rest break) Assistive device: None Ambulation/Gait Assistance Details: Pt pushed IV pole for stability Gait Pattern: Step-through pattern;Decreased stance time - right;Decreased step length - left;Right flexed knee in stance;Trunk flexed (Decreased hip extension - right) Gait velocity: Slow gait  speed General Gait Details: pt with some dypnea on exertion, with O2 sats 94% on RA, HR 115 Stairs: No Wheelchair Mobility Wheelchair Mobility: No    Exercises Other Exercises Other Exercises: discussed with Pt and wife about doing short bouts of exercise frequently througout the day to increased activity tolerance. Other Exercises: standing isometrics of abds and glutes   PT Diagnosis:    PT Problem List:   PT Treatment Interventions:     PT Goals    Visit Information  Last PT Received On: 03/06/13 Assistance Needed: +1    Subjective Data  Subjective: I have oxygen at home Patient Stated Goal: to go home   Cognition  Cognition Overall Cognitive Status: Appears within functional limits for tasks assessed/performed Arousal/Alertness: Awake/alert Orientation Level: Appears intact for tasks assessed Behavior During Session: [facility] for tasks performed    Balance  Static Sitting Balance Static Sitting - Balance Support: No upper extremity supported Static Sitting - Level of Assistance: 7: Independent Static Standing Balance Static Standing - Balance Support: Right upper extremity supported Static Standing - Level of Assistance: 6: Modified independent (Device/Increase time) Static Standing - Comment/# of Minutes: pt stands on left leg with toe of right foot on floor  End of Session     GP    Kayton Dunaj K. Manson Passey, [place] [phone] 03/06/2013, 12:21 PM  Addendum:  Pt sleeping this afternoon. Written copy of basic core activation LE exercise left with wife.  She acknowledges she understands and will give them to pt Rosey Bath K. Imaan Padgett,PT

## 2013-03-07 ENCOUNTER — Other Ambulatory Visit: Payer: Medicare Other | Admitting: Lab

## 2013-03-07 ENCOUNTER — Ambulatory Visit (HOSPITAL_COMMUNITY): Payer: Medicare HMO

## 2013-03-07 LAB — CBC
MCH: 29.6 pg (ref 26.0–34.0)
MCHC: 33.2 g/dL (ref 30.0–36.0)
MCV: 89 fL (ref 78.0–100.0)
Platelets: 282 10*3/uL (ref 150–400)
RDW: 15.3 % (ref 11.5–15.5)

## 2013-03-07 LAB — CULTURE, RESPIRATORY W GRAM STAIN

## 2013-03-07 LAB — BASIC METABOLIC PANEL
BUN: 15 mg/dL (ref 6–23)
CO2: 34 mEq/L — ABNORMAL HIGH (ref 19–32)
Calcium: 8.7 mg/dL (ref 8.4–10.5)
Creatinine, Ser: 0.99 mg/dL (ref 0.50–1.35)
Glucose, Bld: 81 mg/dL (ref 70–99)

## 2013-03-07 MED ORDER — AMOXICILLIN-POT CLAVULANATE 875-125 MG PO TABS: 1.0000 | ORAL_TABLET | Freq: Two times a day (BID) | ORAL | Status: DC

## 2013-03-07 MED ORDER — PREDNISONE 20 MG PO TABS: 10.0000 mg | ORAL_TABLET | Freq: Every day | ORAL | Status: DC

## 2013-03-07 MED ORDER — ALBUTEROL SULFATE (2.5 MG/3ML) 0.083% IN NEBU: 2.5000 mg | INHALATION_SOLUTION | RESPIRATORY_TRACT | Status: AC | PRN

## 2013-03-07 MED ORDER — AMOXICILLIN-POT CLAVULANATE 875-125 MG PO TABS
1.0000 | ORAL_TABLET | Freq: Two times a day (BID) | ORAL | Status: DC
  Filled 2013-03-07 (×2): qty 1

## 2013-03-07 NOTE — Discharge Summary (Signed)
Physician Discharge Summary  Patient ID: Anthony [name] MRN: [id number] DOB/AGE: May 24, 1951 62 y.o.  Admit date: 03/02/2013 Discharge date: 03/07/2013  Admission Diagnoses: Healthcare associated pneumonia COPD exacerbation Sepsis, mild Lung cancer Seizure disorder Anemia of chronic disease  Discharge Diagnoses:  Principal Problem:   HCAP (healthcare-associated pneumonia) Active Problems:   COPD exacerbation   Sepsis, mild   Lung cancer   Seizures disorder Anemia of chronic disease   Discharged Condition: good  Hospital Course: The patient was admitted on 03/02/2013. He has a history of small cell lung cancer status post chemotherapy and radiation therapy last cycle November 2013. He did feeling sick for 2 weeks, 10 pound weight loss with decreased appetite fevers chills, chest pain and cough and shortness of breath. He was seen in the office in about 9 days prior to admission a chest x-ray showing multilobar right lung pneumonia, treated as an outpatient with levofloxacin but continued having low-grade fever shortness of breath, chest pain and cough and was admitted for treatment of pneumonia. At admission his laboratories chemistries unremarkable except albumin 2.3, WBC 9.0, hemoglobin 9.4. Chest x-ray showed right upper lobe pneumonia. The patient was also mildly hypotensive with blood pressure as low as the 70s. He was treated with aggressive IV fluids for early sepsis and placed on vancomycin and cefepime. Also started on IV steroids and bronchodilators for COPD exacerbation and wheezing. Urinary streptococcal pneumoniae antigen positive. His blood pressure rose with fluids and stabilized in the 90s systolic. His chest pain, shortness of breath and cough gradually improved and his fever resolved. His appetite improved some although still not great at discharge. He was able to ambulate with physical therapy without much difficulty. Blood cultures remain negative and sputum culture was  negative. He was switched to Augmentin by mouth. His steroids were changed to oral. His hemoglobin at discharge was 8.6, we opted not to transfuse. The patient opted for partial CODE STATUS okay with intubation but not chest compressions.  Consults: None  Significant Diagnostic Studies: labs: At discharge, BUN 15, creatinine 0.99, sodium 139 potassium 3.8, chloride 97, bicarbonate 34, CBC hemoglobin 8.6, WBC 7.8, platelet 282 microbiology: blood culture: negative, sputum culture: negative and urine culture: negative , strep pneumoniae urinary antigen positive and radiology: CXR: infiltrates: upper lobe on the right  Treatments: IV hydration, antibiotics: vancomycin and cefepime, analgesia: Vicodin, steroids: solu-medrol and prednisone and therapies: PT  Discharge Exam: Blood pressure 91/47, pulse 90, temperature 97.8 F (36.6 C), temperature source Oral, resp. rate 18, height 5' 11.5" (1.816 m), weight 61.6 kg (135 lb 12.9 oz), SpO2 94.00%. Resp: rhonchi bilaterally Cardio: regular rate and rhythm, S1, S2 normal, no murmur, click, rub or gallop  Disposition: 01-Home or Self Care   Future Appointments Provider Department Dept Phone   03/21/2013 9:15 AM Windell Hummingbird [facility] [phone]   03/23/2013 10:45 AM Si Gaul, MD  [facility] [phone]       Medication List    TAKE these medications       albuterol (2.5 MG/3ML) 0.083% nebulizer solution  Commonly known as:  PROVENTIL  Take 3 mLs (2.5 mg total) by nebulization every 4 (four) hours as needed for wheezing.     albuterol 108 (90 BASE) MCG/ACT inhaler  Commonly known as:  PROVENTIL HFA;VENTOLIN HFA  Inhale 2 puffs into the lungs every 6 (six) hours as needed. For shortness of breath.     amoxicillin-clavulanate 875-125 MG per tablet  Commonly known  as:  AUGMENTIN  Take 1 tablet by mouth every 12 (twelve) hours.     budesonide-formoterol 160-4.5 MCG/ACT  inhaler  Commonly known as:  SYMBICORT  Inhale 2 puffs into the lungs 2 (two) times daily.     cyanocobalamin 1000 MCG/ML injection  Commonly known as:  (VITAMIN B-12)  Inject into the muscle every 30 (thirty) days. Administers on the 15th of each month.     LORazepam 1 MG tablet  Commonly known as:  ATIVAN  Take 1 mg by mouth 3 (three) times daily.     phenytoin 100 MG ER capsule  Commonly known as:  DILANTIN  Take 200 mg by mouth daily. Takes with a 30mg  tablet to = 230mg      phenytoin 30 MG ER capsule  Commonly known as:  DILANTIN  Take 30 mg by mouth daily. Takes with 200mg  to = 230mg  dose     predniSONE 20 MG tablet  Commonly known as:  DELTASONE  Take 0.5 tablets (10 mg total) by mouth daily with breakfast. 3 a day for 2 days, 2 a day for 2 days, 1 a day for 2 days     promethazine 25 MG tablet  Commonly known as:  PHENERGAN  Take 25 mg by mouth daily as needed. For nausea.     tiotropium 18 MCG inhalation capsule  Commonly known as:  SPIRIVA  Place 18 mcg into inhaler and inhale daily.     VICODIN HP 10-300 MG Tabs  Generic drug:  Hydrocodone-Acetaminophen  Take 1-2 tablets by mouth daily as needed. For pain.           Follow-up Information   Follow up with Lillia Mountain, MD In 10 days.   Contact information:   [address], [address] Jaynie Crumble [place] [address] [phone]       Signed: Lillia Mountain 03/07/2013, 7:43 AM

## 2013-03-08 LAB — CULTURE, BLOOD (ROUTINE X 2): Culture: NO GROWTH

## 2013-03-09 ENCOUNTER — Other Ambulatory Visit: Payer: Medicare Other | Admitting: Lab

## 2013-03-09 ENCOUNTER — Ambulatory Visit: Payer: Medicare Other | Admitting: Internal Medicine

## 2013-03-21 ENCOUNTER — Other Ambulatory Visit: Payer: Self-pay | Admitting: Lab

## 2013-03-22 ENCOUNTER — Telehealth: Payer: Self-pay | Admitting: Internal Medicine

## 2013-03-23 ENCOUNTER — Other Ambulatory Visit: Payer: Self-pay | Admitting: *Deleted

## 2013-03-23 ENCOUNTER — Ambulatory Visit: Payer: Self-pay | Admitting: Internal Medicine

## 2013-03-24 ENCOUNTER — Encounter (HOSPITAL_COMMUNITY): Payer: Self-pay

## 2013-03-24 ENCOUNTER — Ambulatory Visit (HOSPITAL_COMMUNITY)
Admission: RE | Admit: 2013-03-24 | Discharge: 2013-03-24 | Disposition: A | Discharge status: Home / Self Care | Payer: Medicare HMO | Source: Ambulatory Visit | Attending: Internal Medicine | Admitting: Internal Medicine

## 2013-03-24 ENCOUNTER — Telehealth: Payer: Self-pay | Admitting: *Deleted

## 2013-03-24 ENCOUNTER — Encounter (HOSPITAL_COMMUNITY): Payer: Self-pay | Admitting: *Deleted

## 2013-03-24 ENCOUNTER — Inpatient Hospital Stay (HOSPITAL_COMMUNITY)
Admission: EM | Admit: 2013-03-24 | Discharge: 2013-03-27 | DRG: 180 | Disposition: A | Discharge status: Hospice | Payer: Medicare HMO | Attending: Internal Medicine | Admitting: Internal Medicine

## 2013-03-24 ENCOUNTER — Other Ambulatory Visit (HOSPITAL_BASED_OUTPATIENT_CLINIC_OR_DEPARTMENT_OTHER): Payer: Medicare HMO | Admitting: Lab

## 2013-03-24 DIAGNOSIS — C801 Malignant (primary) neoplasm, unspecified: Secondary | ICD-10-CM

## 2013-03-24 DIAGNOSIS — Z66 Do not resuscitate: Secondary | ICD-10-CM | POA: Diagnosis present

## 2013-03-24 DIAGNOSIS — J4489 Other specified chronic obstructive pulmonary disease: Secondary | ICD-10-CM | POA: Diagnosis present

## 2013-03-24 DIAGNOSIS — R059 Cough, unspecified: Secondary | ICD-10-CM

## 2013-03-24 DIAGNOSIS — J9600 Acute respiratory failure, unspecified whether with hypoxia or hypercapnia: Secondary | ICD-10-CM | POA: Diagnosis present

## 2013-03-24 DIAGNOSIS — N2889 Other specified disorders of kidney and ureter: Secondary | ICD-10-CM

## 2013-03-24 DIAGNOSIS — J209 Acute bronchitis, unspecified: Secondary | ICD-10-CM

## 2013-03-24 DIAGNOSIS — R569 Unspecified convulsions: Secondary | ICD-10-CM | POA: Diagnosis present

## 2013-03-24 DIAGNOSIS — G893 Neoplasm related pain (acute) (chronic): Secondary | ICD-10-CM | POA: Diagnosis present

## 2013-03-24 DIAGNOSIS — Z86718 Personal history of other venous thrombosis and embolism: Secondary | ICD-10-CM

## 2013-03-24 DIAGNOSIS — J910 Malignant pleural effusion: Secondary | ICD-10-CM | POA: Diagnosis present

## 2013-03-24 DIAGNOSIS — R634 Abnormal weight loss: Secondary | ICD-10-CM | POA: Diagnosis present

## 2013-03-24 DIAGNOSIS — J9691 Respiratory failure, unspecified with hypoxia: Secondary | ICD-10-CM | POA: Diagnosis present

## 2013-03-24 DIAGNOSIS — C3490 Malignant neoplasm of unspecified part of unspecified bronchus or lung: Principal | ICD-10-CM | POA: Diagnosis present

## 2013-03-24 DIAGNOSIS — R5381 Other malaise: Secondary | ICD-10-CM | POA: Diagnosis present

## 2013-03-24 DIAGNOSIS — R630 Anorexia: Secondary | ICD-10-CM | POA: Diagnosis present

## 2013-03-24 DIAGNOSIS — D630 Anemia in neoplastic disease: Secondary | ICD-10-CM | POA: Diagnosis present

## 2013-03-24 DIAGNOSIS — I82409 Acute embolism and thrombosis of unspecified deep veins of unspecified lower extremity: Secondary | ICD-10-CM

## 2013-03-24 DIAGNOSIS — R222 Localized swelling, mass and lump, trunk: Secondary | ICD-10-CM

## 2013-03-24 DIAGNOSIS — K5010 Crohn's disease of large intestine without complications: Secondary | ICD-10-CM

## 2013-03-24 DIAGNOSIS — C787 Secondary malignant neoplasm of liver and intrahepatic bile duct: Secondary | ICD-10-CM | POA: Diagnosis present

## 2013-03-24 DIAGNOSIS — J439 Emphysema, unspecified: Secondary | ICD-10-CM

## 2013-03-24 DIAGNOSIS — F411 Generalized anxiety disorder: Secondary | ICD-10-CM | POA: Diagnosis present

## 2013-03-24 DIAGNOSIS — D638 Anemia in other chronic diseases classified elsewhere: Secondary | ICD-10-CM

## 2013-03-24 DIAGNOSIS — Z515 Encounter for palliative care: Secondary | ICD-10-CM

## 2013-03-24 DIAGNOSIS — R079 Chest pain, unspecified: Secondary | ICD-10-CM

## 2013-03-24 DIAGNOSIS — G40909 Epilepsy, unspecified, not intractable, without status epilepticus: Secondary | ICD-10-CM

## 2013-03-24 DIAGNOSIS — R599 Enlarged lymph nodes, unspecified: Secondary | ICD-10-CM | POA: Diagnosis present

## 2013-03-24 DIAGNOSIS — G8929 Other chronic pain: Secondary | ICD-10-CM | POA: Diagnosis present

## 2013-03-24 DIAGNOSIS — J449 Chronic obstructive pulmonary disease, unspecified: Secondary | ICD-10-CM

## 2013-03-24 DIAGNOSIS — R05 Cough: Secondary | ICD-10-CM

## 2013-03-24 DIAGNOSIS — Z7901 Long term (current) use of anticoagulants: Secondary | ICD-10-CM

## 2013-03-24 DIAGNOSIS — Z681 Body mass index (BMI) 19 or less, adult: Secondary | ICD-10-CM

## 2013-03-24 DIAGNOSIS — J189 Pneumonia, unspecified organism: Secondary | ICD-10-CM

## 2013-03-24 DIAGNOSIS — R0902 Hypoxemia: Secondary | ICD-10-CM | POA: Diagnosis present

## 2013-03-24 LAB — COMPREHENSIVE METABOLIC PANEL (CC13)
ALT: 10 U/L (ref 0–55)
AST: 30 U/L (ref 5–34)
Calcium: 9.6 mg/dL (ref 8.4–10.4)
Chloride: 97 mEq/L — ABNORMAL LOW (ref 98–107)
Creatinine: 1 mg/dL (ref 0.7–1.3)

## 2013-03-24 LAB — COMPREHENSIVE METABOLIC PANEL
BUN: 15 mg/dL (ref 6–23)
CO2: 28 mEq/L (ref 19–32)
Calcium: 9.3 mg/dL (ref 8.4–10.5)
Chloride: 94 mEq/L — ABNORMAL LOW (ref 96–112)
Creatinine, Ser: 0.93 mg/dL (ref 0.50–1.35)
GFR calc Af Amer: >90 mL/min (ref >90)
GFR calc non Af Amer: 89 mL/min — ABNORMAL LOW (ref >90)
Total Bilirubin: 0.3 mg/dL (ref 0.3–1.2)

## 2013-03-24 LAB — CBC WITH DIFFERENTIAL/PLATELET
BASO%: 0.3 % (ref 0.0–2.0)
EOS%: 1 % (ref 0.0–7.0)
Eosinophils Relative: 1 % (ref 0–5)
HCT: 30.7 % — ABNORMAL LOW (ref 38.4–49.9)
HCT: 29.4 % — ABNORMAL LOW (ref 39.0–52.0)
Hemoglobin: 9.6 g/dL — ABNORMAL LOW (ref 13.0–17.0)
Lymphocytes Relative: 8 % — ABNORMAL LOW (ref 12–46)
Lymphs Abs: 0.6 10*3/uL — ABNORMAL LOW (ref 0.7–4.0)
MCH: 29.2 pg (ref 27.2–33.4)
MCHC: 32.8 g/dL (ref 32.0–36.0)
MCV: 89.9 fL (ref 78.0–100.0)
Monocytes Absolute: 0.7 10*3/uL (ref 0.1–1.0)
Monocytes Relative: 9 % (ref 3–12)
NEUT%: 82.2 % — ABNORMAL HIGH (ref 39.0–75.0)
RBC: 3.27 MIL/uL — ABNORMAL LOW (ref 4.22–5.81)
RDW: 17.1 % — ABNORMAL HIGH (ref 11.0–14.6)
RDW: 16.4 % — ABNORMAL HIGH (ref 11.5–15.5)
WBC: 6.9 10*3/uL (ref 4.0–10.5)
lymph#: 0.6 10*3/uL — ABNORMAL LOW (ref 0.9–3.3)

## 2013-03-24 LAB — PHENYTOIN LEVEL, TOTAL: Phenytoin Lvl: <2.5 ug/mL — ABNORMAL LOW (ref 10.0–20.0)

## 2013-03-24 MED ORDER — PROMETHAZINE HCL 25 MG PO TABS
25.0000 mg | ORAL_TABLET | ORAL | Status: DC | PRN
  Administered 2013-03-24 – 2013-03-26 (×4): 25 mg via ORAL
  Filled 2013-03-24 (×4): qty 1

## 2013-03-24 MED ORDER — ALBUTEROL SULFATE (5 MG/ML) 0.5% IN NEBU
2.5000 mg | INHALATION_SOLUTION | RESPIRATORY_TRACT | Status: DC | PRN
  Administered 2013-03-25 – 2013-03-26 (×4): 2.5 mg via RESPIRATORY_TRACT
  Filled 2013-03-24 (×5): qty 0.5

## 2013-03-24 MED ORDER — ENOXAPARIN SODIUM 30 MG/0.3ML ~~LOC~~ SOLN: 30.0000 mg | SUBCUTANEOUS | Status: DC

## 2013-03-24 MED ORDER — PHENYTOIN SODIUM EXTENDED 30 MG PO CAPS
30.0000 mg | ORAL_CAPSULE | Freq: Every day | ORAL | Status: DC
  Administered 2013-03-24 – 2013-03-25 (×2): 30 mg via ORAL
  Filled 2013-03-24 (×3): qty 1

## 2013-03-24 MED ORDER — MORPHINE SULFATE 10 MG/ML IJ SOLN
10.0000 mg | Freq: Once | INTRAMUSCULAR | Status: AC
  Administered 2013-03-24: 10 mg via INTRAVENOUS
  Filled 2013-03-24: qty 1

## 2013-03-24 MED ORDER — VANCOMYCIN HCL IN DEXTROSE 1-5 GM/200ML-% IV SOLN
1000.0000 mg | Freq: Once | INTRAVENOUS | Status: DC
  Filled 2013-03-24: qty 200

## 2013-03-24 MED ORDER — VANCOMYCIN HCL IN DEXTROSE 750-5 MG/150ML-% IV SOLN
750.0000 mg | Freq: Two times a day (BID) | INTRAVENOUS | Status: DC
  Administered 2013-03-24 – 2013-03-25 (×2): 750 mg via INTRAVENOUS
  Filled 2013-03-24 (×3): qty 150

## 2013-03-24 MED ORDER — SODIUM CHLORIDE 0.9 % IV SOLN
Freq: Once | INTRAVENOUS | Status: AC
  Administered 2013-03-24: 22:00:00 via INTRAVENOUS

## 2013-03-24 MED ORDER — HYDROMORPHONE HCL PF 1 MG/ML IJ SOLN
1.0000 mg | INTRAMUSCULAR | Status: DC | PRN
  Administered 2013-03-24 – 2013-03-25 (×4): 1 mg via INTRAVENOUS
  Filled 2013-03-24 (×4): qty 1

## 2013-03-24 MED ORDER — PIPERACILLIN-TAZOBACTAM 3.375 G IVPB
3.3750 g | Freq: Once | INTRAVENOUS | Status: DC
  Filled 2013-03-24: qty 50

## 2013-03-24 MED ORDER — TIOTROPIUM BROMIDE MONOHYDRATE 18 MCG IN CAPS
18.0000 ug | ORAL_CAPSULE | Freq: Every day | RESPIRATORY_TRACT | Status: DC
  Administered 2013-03-25 – 2013-03-26 (×2): 18 ug via RESPIRATORY_TRACT
  Filled 2013-03-24: qty 5

## 2013-03-24 MED ORDER — SODIUM CHLORIDE 0.9 % IV BOLUS (SEPSIS)
500.0000 mL | Freq: Once | INTRAVENOUS | Status: AC
  Administered 2013-03-24: 500 mL via INTRAVENOUS

## 2013-03-24 MED ORDER — PIPERACILLIN-TAZOBACTAM 3.375 G IVPB
3.3750 g | Freq: Three times a day (TID) | INTRAVENOUS | Status: DC
  Administered 2013-03-24 – 2013-03-25 (×2): 3.375 g via INTRAVENOUS
  Filled 2013-03-24 (×4): qty 50

## 2013-03-24 MED ORDER — HYDROCODONE-ACETAMINOPHEN 5-325 MG PO TABS: 1.0000 | ORAL_TABLET | ORAL | Status: DC | PRN

## 2013-03-24 MED ORDER — ALBUTEROL SULFATE (5 MG/ML) 0.5% IN NEBU
5.0000 mg | INHALATION_SOLUTION | Freq: Once | RESPIRATORY_TRACT | Status: AC
  Administered 2013-03-24: 5 mg via RESPIRATORY_TRACT
  Filled 2013-03-24: qty 1

## 2013-03-24 MED ORDER — PHENYTOIN SODIUM EXTENDED 100 MG PO CAPS
200.0000 mg | ORAL_CAPSULE | Freq: Every day | ORAL | Status: DC
  Administered 2013-03-25 (×2): 200 mg via ORAL
  Filled 2013-03-24 (×3): qty 2

## 2013-03-24 MED ORDER — ENOXAPARIN SODIUM 40 MG/0.4ML ~~LOC~~ SOLN
40.0000 mg | SUBCUTANEOUS | Status: DC
  Administered 2013-03-26: 40 mg via SUBCUTANEOUS
  Filled 2013-03-24 (×4): qty 0.4

## 2013-03-24 MED ORDER — IOHEXOL 300 MG/ML  SOLN
100.0000 mL | Freq: Once | INTRAMUSCULAR | Status: AC | PRN
  Administered 2013-03-24: 100 mL via INTRAVENOUS

## 2013-03-24 MED ORDER — BUDESONIDE-FORMOTEROL FUMARATE 160-4.5 MCG/ACT IN AERO
2.0000 | INHALATION_SPRAY | Freq: Two times a day (BID) | RESPIRATORY_TRACT | Status: DC
  Administered 2013-03-25 – 2013-03-27 (×6): 2 via RESPIRATORY_TRACT
  Filled 2013-03-24: qty 6

## 2013-03-24 MED ORDER — LORAZEPAM 1 MG PO TABS
1.0000 mg | ORAL_TABLET | Freq: Three times a day (TID) | ORAL | Status: DC | PRN
  Administered 2013-03-25 (×2): 1 mg via ORAL
  Filled 2013-03-24 (×2): qty 1

## 2013-03-24 MED ORDER — MORPHINE SULFATE ER 30 MG PO TBCR
30.0000 mg | EXTENDED_RELEASE_TABLET | Freq: Two times a day (BID) | ORAL | Status: DC
  Administered 2013-03-24 – 2013-03-25 (×2): 30 mg via ORAL
  Filled 2013-03-24 (×2): qty 1

## 2013-03-24 MED ORDER — IPRATROPIUM BROMIDE 0.02 % IN SOLN
0.5000 mg | Freq: Once | RESPIRATORY_TRACT | Status: AC
  Administered 2013-03-24: 0.5 mg via RESPIRATORY_TRACT
  Filled 2013-03-24: qty 2.5

## 2013-03-24 MED ORDER — SODIUM CHLORIDE 0.9 % IJ SOLN: 3.0000 mL | Freq: Two times a day (BID) | INTRAMUSCULAR | Status: DC

## 2013-03-24 MED ORDER — PROMETHAZINE HCL 25 MG/ML IJ SOLN
25.0000 mg | Freq: Once | INTRAMUSCULAR | Status: DC
  Filled 2013-03-24: qty 1

## 2013-03-24 NOTE — Progress Notes (Signed)
ANTIBIOTIC CONSULT NOTE - INITIAL  Pharmacy Consult for Vancomycin/Zosyn  Indication: pneumonia  No Known Allergies  Patient Measurements: Height: 5' 11.65" (182 cm) (Documented 03/02/13) Weight: 135 lb 12.9 oz (61.6 kg) (documented 03/05/13) IBW/kg (Calculated) : 76.8   Vital Signs: Temp: 100.3 F (37.9 C) (04/11 1834) Temp src: Oral (04/11 1834) BP: 101/68 mmHg (04/11 1832) Pulse Rate: 111 (04/11 1843) Intake/Output from previous day:   Intake/Output from this shift:    Labs:  Recent Labs  03/24/13 1311 03/24/13 1937  WBC 8.0 6.9  HGB 10.1* 9.6*  PLT 328 286  CREATININE 1.0 0.93   Estimated Creatinine Clearance: 72.7 ml/min (by C-G formula based on Cr of 0.93). No results found for this basename: VANCOTROUGH, VANCOPEAK, VANCORANDOM, GENTTROUGH, GENTPEAK, GENTRANDOM, TOBRATROUGH, TOBRAPEAK, TOBRARND, AMIKACINPEAK, AMIKACINTROU, AMIKACIN,  in the last 72 hours   Microbiology: Recent Results (from the past 720 hour(s))  CULTURE, BLOOD (ROUTINE X 2)     Status: None   Collection Time    03/02/13 10:00 AM      Result Value Range Status   Specimen Description BLOOD LEFT ANTECUBITAL   Final   Special Requests BOTTLES DRAWN AEROBIC ONLY 10CC   Final   Culture  Setup Time 03/02/2013 14:14   Final   Culture NO GROWTH 5 DAYS   Final   Report Status 03/08/2013 FINAL   Final  CULTURE, BLOOD (ROUTINE X 2)     Status: None   Collection Time    03/02/13 10:15 AM      Result Value Range Status   Specimen Description BLOOD RIGHT ANTECUBITAL   Final   Special Requests BOTTLES DRAWN AEROBIC ONLY 10CC   Final   Culture  Setup Time 03/02/2013 14:15   Final   Culture NO GROWTH 5 DAYS   Final   Report Status 03/08/2013 FINAL   Final  URINE CULTURE     Status: None   Collection Time    03/02/13 12:46 PM      Result Value Range Status   Specimen Description URINE, RANDOM   Final   Special Requests NONE   Final   Culture  Setup Time 03/02/2013 13:42   Final   Colony Count NO  GROWTH   Final   Culture NO GROWTH   Final   Report Status 03/03/2013 FINAL   Final  CULTURE, EXPECTORATED SPUTUM-ASSESSMENT     Status: None   Collection Time    03/05/13 11:24 AM      Result Value Range Status   Specimen Description SPUTUM   Final   Special Requests Immunocompromised   Final   Sputum evaluation     Final   Value: THIS SPECIMEN IS ACCEPTABLE. RESPIRATORY CULTURE REPORT TO FOLLOW.   Report Status 03/05/2013 FINAL   Final  CULTURE, RESPIRATORY (NON-EXPECTORATED)     Status: None   Collection Time    03/05/13 11:24 AM      Result Value Range Status   Specimen Description SPUTUM   Final   Special Requests NONE   Final   Gram Stain     Final   Value: RARE WBC PRESENT,BOTH PMN AND MONONUCLEAR     NO SQUAMOUS EPITHELIAL CELLS SEEN     NO ORGANISMS SEEN   Culture NO GROWTH 2 DAYS   Final   Report Status 03/07/2013 FINAL   Final    Medical History: Past Medical History  Diagnosis Date  . Crohn's disease of colon   . Pulmonary emboli   .  COPD (chronic obstructive pulmonary disease)   . Seizure disorder   . Kidney mass   . Arthritis   . Emphysema of lung   . Weight loss, unintentional   . Cough   . Chest pain   . Weakness   . Seizures     last one ~ 3 yrs ago  . Hx of radiation therapy 10/13/12 -10/26/12    central chest, brain-prophylacitc  . Shortness of breath   . Pneumonia 03/02/2013  . Cancer 06/09/12    lung RUL  . Lung cancer 06/09/12    biopsy- RUL neuroendocrine carcinoma, small cell type    Medications:  Scheduled:  . [COMPLETED] albuterol  5 mg Nebulization Once  . budesonide-formoterol  2 puff Inhalation BID  . enoxaparin (LOVENOX) injection  40 mg Subcutaneous Q24H  . [COMPLETED] ipratropium  0.5 mg Nebulization Once  . morphine  30 mg Oral BID  . [COMPLETED]  morphine injection  10 mg Intravenous Once  . phenytoin  200 mg Oral Daily  . phenytoin  30 mg Oral Daily  . sodium chloride  3 mL Intravenous Q12H  . tiotropium  18 mcg Inhalation  Daily  . [DISCONTINUED] enoxaparin (LOVENOX) injection  30 mg Subcutaneous Q24H  . [DISCONTINUED] promethazine  25 mg Intravenous Once   Infusions:  . [COMPLETED] sodium chloride 500 mL (03/24/13 2007)  . [DISCONTINUED] piperacillin-tazobactam (ZOSYN)  IV    . 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  Vancomycin and Zosyn per pharmacy   WBC WNL, Scr WNL, CrCl 72 ml/min   AF   Goal of Therapy:   Vancomycin trough level 15-20 mcg/ml  Zosyn per renal function   Plan:  1.) Vancomycin 750 mg IV q12h  2.) Zosyn 3.375 mg IV Q8h 3.) Monitor renal function  4.) Vancomycin troughs as needed   Salem Lembke, Loma Messing PharmD Pager #: [phone] 9:47 PM 03/24/2013

## 2013-03-24 NOTE — ED Provider Notes (Signed)
History     CSN: [id number]  Arrival date & time 03/24/13  1819   First MD Initiated Contact with Patient 03/24/13 1838      Chief Complaint  Patient presents with  . Shortness of Breath  . lung cancer pt     (Consider location/radiation/quality/duration/timing/severity/associated sxs/prior treatment) Patient is a 62 y.o. male presenting with shortness of breath. The history is provided by the patient.  Shortness of Breath Severity:  Moderate Associated symptoms: cough and fever   Associated symptoms: no abdominal pain, no chest pain, no headaches, no rash and no vomiting    patient presents with worsening shortness of breath. He was recently in the hospital for healthcare associated pneumonia and was discharged approximately 2 weeks ago. He has finished up his antibiotics. He states he has had some chills and possibly fevers at home. He's had a cough that is pretty much unchanged. He states he's had increasing fatigue. He was seen and had a CT scan done today and was told to come in to the hospital after she talk to the doctors and told him he was not feeling any better.  Past Medical History  Diagnosis Date  . Crohn's disease of colon   . Pulmonary emboli   . COPD (chronic obstructive pulmonary disease)   . Seizure disorder   . Kidney mass   . Arthritis   . Emphysema of lung   . Weight loss, unintentional   . Cough   . Chest pain   . Weakness   . Seizures     last one ~ 3 yrs ago  . Hx of radiation therapy 10/13/12 -10/26/12    central chest, brain-prophylacitc  . Shortness of breath   . Pneumonia 03/02/2013  . Cancer 06/09/12    lung RUL  . 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 Arm surgery      left  . Right knee      right knee, hx of injury  . Rectal surgery      x7, Crohn's disease  . Abdominal surgery      x3, Crohn's disease  . Video bronchoscopy  06/09/2012     Procedure: VIDEO BRONCHOSCOPY WITHOUT FLUORO;  Surgeon: Leslye Peer, MD;  Location: [facility];  Service: Cardiopulmonary;  Laterality: Bilateral;  . Portacath placement  07/05/2012    Procedure: INSERTION PORT-A-CATH;  Surgeon: Mariella Saa, MD;  Location: [facility] OR;  Service: General;  Laterality: N/A;  . Port-a-cath removal  08/24/2012    Procedure: MINOR REMOVAL PORT-A-CATH;  Surgeon: Mariella Saa, MD;  Location: [facility];  Service: General;  Laterality: Right;    Family History  Problem Relation Age of Onset  . Emphysema Brother   . Cancer Brother     lung  . Emphysema Brother     Died with lung cancer-Jerry  . Liver cancer Brother     Died- Freddy  . Diabetes Brother   . Hypertension Brother   . Diabetes Sister   . Diabetes Sister   . Heart disease Father   . Heart disease Mother   . Heart disease Brother   . Heart disease Brother     valve reconstruction at young age    History  Substance Use Topics  . Smoking status: Current Every Day Smoker -- 1.00 packs/day for 52 years    Types: Cigarettes  . Smokeless  tobacco: Never Used     Comment: Pt states he used 2-2.5ppd in past, 09/19/12 < 1 PPD  . Alcohol Use: No     Comment: "very little"      Review of Systems  Constitutional: Positive for fever, appetite change, fatigue and unexpected weight change. Negative for activity change.  HENT: Negative for neck stiffness.   Eyes: Negative for pain.  Respiratory: Positive for cough and shortness of breath. Negative for chest tightness.   Cardiovascular: Positive for leg swelling. Negative for chest pain.  Gastrointestinal: Negative for nausea, vomiting, abdominal pain and diarrhea.  Genitourinary: Negative for flank pain.  Musculoskeletal: Negative for back pain.  Skin: Negative for rash.  Neurological: Negative for weakness, numbness and headaches.  Psychiatric/Behavioral: Negative for behavioral problems.    Allergies  Review of  patient's allergies indicates no known allergies.  Home Medications   Current Outpatient Rx  Name  Route  Sig  Dispense  Refill  . albuterol (PROVENTIL HFA;VENTOLIN HFA) 108 (90 BASE) MCG/ACT inhaler   Inhalation   Inhale 2 puffs into the lungs every 6 (six) hours as needed. For shortness of breath.         Marland Kitchen albuterol (PROVENTIL) (2.5 MG/3ML) 0.083% nebulizer solution   Nebulization   Take 3 mLs (2.5 mg total) by nebulization every 4 (four) hours as needed for wheezing.   150 mL   12   . budesonide-formoterol (SYMBICORT) 160-4.5 MCG/ACT inhaler   Inhalation   Inhale 2 puffs into the lungs 2 (two) times daily.         . cyanocobalamin (,VITAMIN B-12,) 1000 MCG/ML injection   Intramuscular   Inject into the muscle every 30 (thirty) days. Administers on the 15th of each month.         Marland Kitchen LORazepam (ATIVAN) 1 MG tablet   Oral   Take 1 mg by mouth 3 (three) times daily.         Marland Kitchen morphine (MS CONTIN) 30 MG 12 hr tablet   Oral   Take 30 mg by mouth 2 (two) times daily.         . phenytoin (DILANTIN) 100 MG ER capsule   Oral   Take 200 mg by mouth daily. Takes with a 30mg  tablet to = 230mg          . phenytoin (DILANTIN) 30 MG ER capsule   Oral   Take 30 mg by mouth daily. Takes with 200mg  to = 230mg  dose         . promethazine (PHENERGAN) 25 MG tablet   Oral   Take 25 mg by mouth daily as needed. For nausea.         Marland Kitchen tiotropium (SPIRIVA) 18 MCG inhalation capsule   Inhalation   Place 18 mcg into inhaler and inhale daily.           BP 114/67  Pulse 105  Temp(Src) 100.3 F (37.9 C) (Oral)  Resp 18  Ht 5' 11.65" (1.82 m)  Wt 135 lb 12.9 oz (61.6 kg)  BMI 18.6 kg/m2  SpO2 92%  Physical Exam  Nursing note and vitals reviewed. Constitutional: He is oriented to person, place, and time. He appears well-developed and well-nourished.  HENT:  Head: Normocephalic and atraumatic.  Eyes: EOM are normal. Pupils are equal, round, and reactive to light.   Neck: Normal range of motion. Neck supple.  Cardiovascular: Regular rhythm and normal heart sounds.   No murmur heard. Mild tachycardia  Pulmonary/Chest: Effort normal. He has  wheezes. He has rales.  Diffuse wheezes and prolonged expirations.  Abdominal: Soft. Bowel sounds are normal. He exhibits no distension and no mass. There is no tenderness. There is no rebound and no guarding.  Musculoskeletal: Normal range of motion. He exhibits no edema.  Neurological: He is alert and oriented to person, place, and time. No cranial nerve deficit.  Skin: Skin is warm and dry.  Psychiatric: He has a normal mood and affect.    ED Course  Procedures (including critical care time)  Labs Reviewed  CBC WITH DIFFERENTIAL - Abnormal; Notable for the following:    RBC 3.27 (*)    Hemoglobin 9.6 (*)    HCT 29.4 (*)    RDW 16.4 (*)    Neutrophils Relative 81 (*)    Lymphocytes Relative 8 (*)    Lymphs Abs 0.6 (*)    All other components within normal limits  COMPREHENSIVE METABOLIC PANEL - Abnormal; Notable for the following:    Chloride 94 (*)    Glucose, Bld 104 (*)    Albumin 2.4 (*)    Alkaline Phosphatase 160 (*)    GFR calc non Af Amer 89 (*)    All other components within normal limits  CULTURE, BLOOD (ROUTINE X 2)  CULTURE, BLOOD (ROUTINE X 2)  CULTURE, EXPECTORATED SPUTUM-ASSESSMENT  GRAM STAIN  PHENYTOIN LEVEL, TOTAL  BASIC METABOLIC PANEL  CBC  LEGIONELLA ANTIGEN, URINE  STREP PNEUMONIAE URINARY ANTIGEN   Ct Chest W Contrast  03/24/2013  *RADIOLOGY REPORT*  Clinical Data:  History of lung cancer diagnosed in June 2013 status post chemotherapy and radiation therapy completed in December 02, 2012.  CT CHEST, ABDOMEN AND PELVIS WITH CONTRAST  Technique:  Multidetector CT imaging of the chest, abdomen and pelvis was performed following the standard protocol during bolus administration of intravenous contrast.  Contrast: OMNIPAQUE IOHEXOL 300 MG/ML  SOLN  Comparison:   None.  CT  CHEST  Findings:  Mediastinum: Significant worsening of the right hilar and mediastinal lymphadenopathy compared to the prior study.  Specific examples include a conglomerate nodal mass in the right hilum which is difficult to measure as it is contiguous with an apparent mass in the right perihilar region, a subcarinal nodal mass measuring 4.2 x 2.9 cm, low left paratracheal lymph node measuring 1.9 cm, and multiple right paratracheal lymph nodes measuring up to 2 cm. In addition, there is a superior mediastinal adenopathy measuring up to 2.2 cm interposed between the origins of the left common carotid and left subclavian artery, as well as numerous enlarged left supraclavicular lymph nodes. Heart size is normal. There is no significant pericardial fluid, thickening or pericardial calcification.  The subcarinal nodal mass exerts significant mass effect upon the adjacent esophagus.  The esophagus is otherwise unremarkable in appearance.  Lungs/Pleura: There is a mass-like area in the perihilar aspect of the right upper lobe which has significantly increased compared to the prior examination, and appears to be violating the major fissure with direct extension into the superior segment of the right lower lobe on today's examination.  This lesion is difficult to discretely measure, as it is contiguous with a severe postobstructive changes in the right upper lobe, which contains internal areas of decreased enhancement which likely reflect necrosis, as well as internal locules of gas, which are suspicious for abscess formation, best demonstrated on image 27 of series 2. There is extensive nodular thickening of the peribronchovascular interstitium throughout the right upper and lower lobes, compatible with advanced lymphangitic  spread of disease.  Multifocal pleural- based nodularity is seen throughout the right hemithorax, compatible with pleural metastases.  There is a moderate right- sided presumably malignant pleural  effusion which is new compared to the prior study.  A background of centrilobular emphysema is mild to moderate.  A few scattered tiny nodules are also noted in the left lung, and are nonspecific.  Musculoskeletal: There are no aggressive appearing lytic or blastic lesions noted in the visualized portions of the skeleton.  IMPRESSION:  1.  Findings, as above, compatible with severe progression of disease. Specifically, there appears to be local recurrence of the primary mass in the perihilar aspect of the right upper lobe, extensive lymphangitic spread of disease throughout the right upper and lower lobes, new pleural disease with a malignant right-sided pleural effusion, and extensive right hilar, mediastinal and left supraclavicular lymphadenopathy, as above. 2.  Postobstructive changes in the right upper lobe with areas of internal necrosis and likely superinfection, with probable abscess formation in the right upper lobe.  CT ABDOMEN AND PELVIS  Findings:  Abdomen/Pelvis: Compared to the prior examination there are multiple new hepatic lesions which are hypovascular and highly suspicious for metastatic disease.  The largest lesion is in segment 4A of the liver measuring 2.7 cm in diameter (image 65 of series 2).  The appearance of the gallbladder, pancreas, spleen and bilateral adrenal glands is unremarkable.  Multiple low-attenuation lesions are again noted in the kidneys bilaterally, similar to prior studies, likely to predominately reflect simple cysts (many are too small to definitively characterize).  In addition, there is a large enhancing lesion in the medial aspect of the upper pole of the left kidney measuring 2.9 x 2.5 cm, suspicious for either a primary neoplasm or a metastatic lesion.  Surgical clips are noted adjacent to the right hemicolon and underneath the liver.  Atherosclerosis throughout the abdominal and pelvic vasculature, without definite aneurysm or dissection. Status post subtotal  colectomy with diverting colostomy in the left lower quadrant of the abdomen.  No significant volume of ascites, no pneumoperitoneum and no pathologic distension of small bowel. No definite pathologic lymphadenopathy identified within the abdomen or pelvis.  Musculoskeletal: There are no aggressive appearing lytic or blastic lesions noted in the visualized portions of the skeleton.  IMPRESSION:  1.  Interval development of new lesions in the liver highly suspicious for metastatic disease. 2.  Unchanged enhancing soft tissue mass in the medial aspect of the upper pole of the left kidney suspicious for a primary renal cell carcinoma or metastatic lesion. 3.  Status post subtotal colectomy with diverting ileostomy in the left lower quadrant.  These results were called by telephone on 03/24/2013 at 03:50 p.m. to Dr. Darrold Span, who verbally acknowledged these results.   Original Report Authenticated By: Trudie Reed, M.D.    Ct Abdomen Pelvis W Contrast  03/24/2013  *RADIOLOGY REPORT*  Clinical Data:  History of lung cancer diagnosed in June 2013 status post chemotherapy and radiation therapy completed in December 02, 2012.  CT CHEST, ABDOMEN AND PELVIS WITH CONTRAST  Technique:  Multidetector CT imaging of the chest, abdomen and pelvis was performed following the standard protocol during bolus administration of intravenous contrast.  Contrast: OMNIPAQUE IOHEXOL 300 MG/ML  SOLN  Comparison:   None.  CT CHEST  Findings:  Mediastinum: Significant worsening of the right hilar and mediastinal lymphadenopathy compared to the prior study.  Specific examples include a conglomerate nodal mass in the right hilum which is difficult to measure  as it is contiguous with an apparent mass in the right perihilar region, a subcarinal nodal mass measuring 4.2 x 2.9 cm, low left paratracheal lymph node measuring 1.9 cm, and multiple right paratracheal lymph nodes measuring up to 2 cm. In addition, there is a superior mediastinal  adenopathy measuring up to 2.2 cm interposed between the origins of the left common carotid and left subclavian artery, as well as numerous enlarged left supraclavicular lymph nodes. Heart size is normal. There is no significant pericardial fluid, thickening or pericardial calcification.  The subcarinal nodal mass exerts significant mass effect upon the adjacent esophagus.  The esophagus is otherwise unremarkable in appearance.  Lungs/Pleura: There is a mass-like area in the perihilar aspect of the right upper lobe which has significantly increased compared to the prior examination, and appears to be violating the major fissure with direct extension into the superior segment of the right lower lobe on today's examination.  This lesion is difficult to discretely measure, as it is contiguous with a severe postobstructive changes in the right upper lobe, which contains internal areas of decreased enhancement which likely reflect necrosis, as well as internal locules of gas, which are suspicious for abscess formation, best demonstrated on image 27 of series 2. There is extensive nodular thickening of the peribronchovascular interstitium throughout the right upper and lower lobes, compatible with advanced lymphangitic spread of disease.  Multifocal pleural- based nodularity is seen throughout the right hemithorax, compatible with pleural metastases.  There is a moderate right- sided presumably malignant pleural effusion which is new compared to the prior study.  A background of centrilobular emphysema is mild to moderate.  A few scattered tiny nodules are also noted in the left lung, and are nonspecific.  Musculoskeletal: There are no aggressive appearing lytic or blastic lesions noted in the visualized portions of the skeleton.  IMPRESSION:  1.  Findings, as above, compatible with severe progression of disease. Specifically, there appears to be local recurrence of the primary mass in the perihilar aspect of the right  upper lobe, extensive lymphangitic spread of disease throughout the right upper and lower lobes, new pleural disease with a malignant right-sided pleural effusion, and extensive right hilar, mediastinal and left supraclavicular lymphadenopathy, as above. 2.  Postobstructive changes in the right upper lobe with areas of internal necrosis and likely superinfection, with probable abscess formation in the right upper lobe.  CT ABDOMEN AND PELVIS  Findings:  Abdomen/Pelvis: Compared to the prior examination there are multiple new hepatic lesions which are hypovascular and highly suspicious for metastatic disease.  The largest lesion is in segment 4A of the liver measuring 2.7 cm in diameter (image 65 of series 2).  The appearance of the gallbladder, pancreas, spleen and bilateral adrenal glands is unremarkable.  Multiple low-attenuation lesions are again noted in the kidneys bilaterally, similar to prior studies, likely to predominately reflect simple cysts (many are too small to definitively characterize).  In addition, there is a large enhancing lesion in the medial aspect of the upper pole of the left kidney measuring 2.9 x 2.5 cm, suspicious for either a primary neoplasm or a metastatic lesion.  Surgical clips are noted adjacent to the right hemicolon and underneath the liver.  Atherosclerosis throughout the abdominal and pelvic vasculature, without definite aneurysm or dissection. Status post subtotal colectomy with diverting colostomy in the left lower quadrant of the abdomen.  No significant volume of ascites, no pneumoperitoneum and no pathologic distension of small bowel. No definite pathologic lymphadenopathy identified within  the abdomen or pelvis.  Musculoskeletal: There are no aggressive appearing lytic or blastic lesions noted in the visualized portions of the skeleton.  IMPRESSION:  1.  Interval development of new lesions in the liver highly suspicious for metastatic disease. 2.  Unchanged enhancing soft  tissue mass in the medial aspect of the upper pole of the left kidney suspicious for a primary renal cell carcinoma or metastatic lesion. 3.  Status post subtotal colectomy with diverting ileostomy in the left lower quadrant.  These results were called by telephone on 03/24/2013 at 03:50 p.m. to Dr. Darrold Span, who verbally acknowledged these results.   Original Report Authenticated By: Trudie Reed, M.D.      1. HCAP (healthcare-associated pneumonia)   2. Metastatic lung cancer, unspecified laterality   3. Cancer   4. Chest mass       MDM  Patient with worsening shortness of breath. Outpatient CT shows worsening lung cancer with new metastasis. Also has HCAP/postobstructive pneumonia. Treat with antibiotics. Also possible lung abscess. Will be admitted to internal medicine. John griffin is the PCP        [employer]. Rubin Payor, MD 03/24/13 2145

## 2013-03-24 NOTE — ED Notes (Signed)
Pt recently in [facility] for [facility]. D/c 3/20. Had CT done today and pt was called and told to come to ED. Increasing SOB. Wears 2 L [place] continuously at home. Dx with stage 1 lung cancer. Right sided chest pain 8/10. Family reports pt has lost 20 pounds in last month.

## 2013-03-24 NOTE — H&P (Addendum)
[facility] History and Physical  Anthony [name] WUJ:[id number] DOB: [birth year] DOA: 03/24/2013  Referring physician: ER physician PCP: Anthony Mountain, MD   Chief Complaint: Shortness of breath   HPI:  Pt is 62 yo male with small lung cancer, status post chemotherapy and radiation therapy last cycle in November 2013, who was recently discharged from the hospital after being treated for HCAP, no presenting to [facility] ED with progressively worsening shortness of breath and cough that has been productive of yellow sputum but unchanged per pt since last admission. Pt reports he has not felt well, continues to loose weight, has poor oral intake, subjective fevers, chills, generalized fatigue and weakness. Pt reports being seen today and had CT chest done, he was called to come in to [facility] ED based on CT findings. Pt follow with Dr. Arbutus [name].  In ED, pt still in respiratory distress with oxygen saturation in 80's, stable on [place], CT chest with findings compatible with severe progression of disease, local recurrence of the primary mass in the perihilar aspect of the right upper lobe, extensive lymphangitic spread of disease throughout the right upper and lower lobes, new pleural disease with a malignant right-sided pleural effusion, and extensive right hilar, mediastinal and left supraclavicular lymphadenopathy, as above. [facility] asked to admit the pt.   Principal Problem:   Respiratory failure with hypoxia - secondary to progressive lung cancer with new pleural effusion, appears to be malignant, ? 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 Arbutus [name] in AM   Seizures - continue  Dilantin as per home medical regimen    Anemia of chronic disease - Hg and Hct stable and at pt's baseline, CBC in AM  Review of Systems:  Constitutional: Positive for fever, chills and malaise/fatigue. Negative for diaphoresis.  HENT: Negative for hearing loss, ear pain, nosebleeds, congestion, sore throat, neck pain, tinnitus and ear discharge.   Eyes: Negative for blurred vision, double vision, photophobia, pain, discharge and redness.  Respiratory: Positive for cough, sputum production, shortness of breath, wheezing.   Cardiovascular: Negative for chest pain, palpitations, orthopnea, claudication and leg swelling.  Gastrointestinal: Positive for nausea, vomiting and abdominal pain. Negative for heartburn, constipation, blood in stool and melena.  Genitourinary: Negative for dysuria, urgency, frequency, hematuria and flank pain.  Musculoskeletal: Negative for myalgias, back pain, joint pain and falls.  Skin: Negative for itching and rash.  Neurological: Negative for tingling, tremors, sensory change, speech change, focal weakness, loss of consciousness and headaches.  Endo/Heme/Allergies: Negative for environmental allergies and polydipsia. Does not bruise/bleed easily.  Psychiatric/Behavioral: Negative for suicidal ideas. The patient is not nervous/anxious.      Past Medical History  Diagnosis Date  . Crohn's disease of colon   . Pulmonary emboli   . COPD (chronic obstructive pulmonary disease)   . Seizure disorder   . Kidney mass   . Arthritis   . Emphysema of lung   . Weight loss, unintentional   . Cough   . Chest pain   . Weakness   . Seizures     last one ~ 3 yrs ago  . Hx of radiation therapy 10/13/12 -10/26/12    central chest, brain-prophylacitc  .  Shortness of breath   . Pneumonia 03/02/2013  . Cancer 06/09/12    lung RUL  . Lung cancer 06/09/12    biopsy- RUL neuroendocrine carcinoma, small cell type   Past Surgical History  Procedure Laterality Date  . Arm  surgery      left  . Right knee      right knee, hx of injury  . Rectal surgery      x7, Crohn's disease  . Abdominal surgery      x3, Crohn's disease  . Video bronchoscopy  06/09/2012    Procedure: VIDEO BRONCHOSCOPY WITHOUT FLUORO;  Surgeon: Leslye Peer, MD;  Location: [facility];  Service: Cardiopulmonary;  Laterality: Bilateral;  . Portacath placement  07/05/2012    Procedure: INSERTION PORT-A-CATH;  Surgeon: Mariella Saa, MD;  Location: [facility] OR;  Service: General;  Laterality: N/A;  . Port-a-cath removal  08/24/2012    Procedure: MINOR REMOVAL PORT-A-CATH;  Surgeon: Mariella Saa, MD;  Location: [facility];  Service: General;  Laterality: Right;   Social History:  reports that he has been smoking Cigarettes.  He has a 52 pack-year smoking history. He has never used smokeless tobacco. He reports that he does not drink alcohol or use illicit drugs.  No Known Allergies  Family History: no history of cancers, no cardiovascular diseases on mother or father side  Prior to Admission medications   Medication Sig Start Date End Date Taking? Authorizing Provider  albuterol (PROVENTIL HFA;VENTOLIN HFA) 108 (90 BASE) MCG/ACT inhaler Inhale 2 puffs into the lungs every 6 (six) hours as needed. For shortness of breath.   Yes Historical Provider, MD  albuterol (PROVENTIL) (2.5 MG/3ML) 0.083% nebulizer solution Take 3 mLs (2.5 mg total) by nebulization every 4 (four) hours as needed for wheezing. 03/07/13  Yes Anthony Mountain, MD  budesonide-formoterol [facility]) 160-4.5 MCG/ACT inhaler Inhale 2 puffs into the lungs 2 (two) times daily.   Yes Historical Provider, MD  cyanocobalamin (,VITAMIN B-12,) 1000 MCG/ML injection Inject into the muscle every 30 (thirty) days. Administers on the 15th of each month.   Yes Historical Provider, MD  LORazepam (ATIVAN) 1 MG tablet Take 1 mg by mouth 3 (three) times daily.   Yes Historical Provider, MD  morphine (MS CONTIN) 30 MG 12  hr tablet Take 30 mg by mouth 2 (two) times daily.   Yes Historical Provider, MD  phenytoin (DILANTIN) 100 MG ER capsule Take 200 mg by mouth daily. Takes with a 30mg  tablet to = 230mg    Yes Historical Provider, MD  phenytoin (DILANTIN) 30 MG ER capsule Take 30 mg by mouth daily. Takes with 200mg  to = 230mg  dose   Yes Historical Provider, MD  promethazine (PHENERGAN) 25 MG tablet Take 25 mg by mouth daily as needed. For nausea.   Yes Historical Provider, MD  tiotropium (SPIRIVA) 18 MCG inhalation capsule Place 18 mcg into inhaler and inhale daily.   Yes Historical Provider, MD   Physical Exam: Filed Vitals:   03/24/13 1832 03/24/13 1834 03/24/13 1843 03/24/13 2013  BP: 101/68     Pulse:   111   Temp:  100.3 F (37.9 C)    TempSrc:  Oral    Resp: 20  20   SpO2: 88% 92% 94% 95%    Physical Exam  Constitutional: Appears well-developed and well-nourished. No distress.  HENT: Normocephalic. External right and left ear normal. Dry MM Eyes: Conjunctivae and EOM are normal. PERRLA, no scleral icterus.  Neck: Normal ROM.  Neck supple. No JVD. No tracheal deviation. No thyromegaly.  CVS: RRR, S1/S2 +, no murmurs, no gallops, no carotid bruit.  Pulmonary: Diminished breath sounds bilaterally, diffuse wheezing Abdominal: Soft. BS +,  slightly distended, tenderness in epigastric area, no rebound or guarding.  Musculoskeletal: Normal range of motion. No edema and no tenderness.  Lymphadenopathy: No lymphadenopathy noted, cervical, inguinal. Neuro: Alert. Normal reflexes, muscle tone coordination. No cranial nerve deficit. Skin: Skin is warm and dry. No rash noted. Not diaphoretic. No erythema. No pallor.  Psychiatric: Normal mood and affect. Behavior, judgment, thought content normal.   Labs on Admission:  Basic Metabolic Panel:  Recent Labs Lab 03/24/13 1311 03/24/13 1937  NA 138 135  K 4.3 3.9  CL 97* 94*  CO2 28 28  GLUCOSE 101* 104*  BUN 16.7 15  CREATININE 1.0 0.93  CALCIUM 9.6  9.3   Liver Function Tests:  Recent Labs Lab 03/24/13 1311 03/24/13 1937  AST 30 28  ALT 10 11  ALKPHOS 180* 160*  BILITOT 0.35 0.3  PROT 6.7 6.4  ALBUMIN 2.3* 2.4*   CBC:  Recent Labs Lab 03/24/13 1311 03/24/13 1937  WBC 8.0 6.9  NEUTROABS 6.6* 5.6  HGB 10.1* 9.6*  HCT 30.7* 29.4*  MCV 88.9 89.9  PLT 328 286   Radiological Exams on Admission: Ct Chest W Contrast Ct Abdomen Pelvis W Contrast 03/24/2013   1.  Findings compatible with severe progression of disease. Specifically, there appears to be local recurrence of the primary mass in the perihilar aspect of the right upper lobe, extensive lymphangitic spread of disease throughout the right upper and lower lobes, new pleural disease with a malignant right-sided pleural effusion, and extensive right hilar, mediastinal and left supraclavicular lymphadenopathy, as above.  2.  Postobstructive changes in the right upper lobe with areas of internal necrosis and likely superinfection, with probable abscess formation in the right upper lobe.   3.  Interval development of new lesions in the liver highly suspicious for metastatic disease.  4.  Unchanged enhancing soft tissue mass in the medial aspect of the upper pole of the left kidney suspicious for a primary renal cell carcinoma or metastatic lesion.  5.  Status post subtotal colectomy with diverting ileostomy in the left lower quadrant.     EKG: Normal sinus rhythm, no ST/T wave changes  Code Status: DNR Family Communication: Pt at bedside Disposition Plan: Admit for further evaluation  Manson Passey, MD  [facility] Pager [phone]  If 7PM-7AM, please contact night-coverage [email or web address] Password [facility] 03/24/2013, 8:50 PM

## 2013-03-24 NOTE — ED Notes (Signed)
ZOX:WRUE<AV> Expected date:<BR> Expected time:<BR> Means of arrival:<BR> Comments:<BR> Still in room at moment

## 2013-03-24 NOTE — Telephone Encounter (Signed)
Dr Nino Parsley received call from radiology regarding pt's CT scan results from today. RN called pt to follow up to see how he is doing. Wife states he is "not good", SOB, low grade temp, not able to eat, had phenergan X 2 today without much relief, and continues to have a lot of pain. Spoke with Dr Darrold Span again. Called wife and told her to take him to [facility] ED. Wife was quite agreeable to this. Report called to [facility] ED charge nurse.

## 2013-03-24 NOTE — ED Notes (Signed)
Per family, pt had "scan" today and PMD sent him here after results were received.  Family not able to give specifics.  Pt appears shob and used inhaler on arrival.

## 2013-03-25 DIAGNOSIS — K5010 Crohn's disease of large intestine without complications: Secondary | ICD-10-CM

## 2013-03-25 DIAGNOSIS — J449 Chronic obstructive pulmonary disease, unspecified: Secondary | ICD-10-CM

## 2013-03-25 DIAGNOSIS — R079 Chest pain, unspecified: Secondary | ICD-10-CM

## 2013-03-25 DIAGNOSIS — C801 Malignant (primary) neoplasm, unspecified: Secondary | ICD-10-CM

## 2013-03-25 DIAGNOSIS — J438 Other emphysema: Secondary | ICD-10-CM

## 2013-03-25 DIAGNOSIS — C3490 Malignant neoplasm of unspecified part of unspecified bronchus or lung: Principal | ICD-10-CM

## 2013-03-25 DIAGNOSIS — N289 Disorder of kidney and ureter, unspecified: Secondary | ICD-10-CM

## 2013-03-25 DIAGNOSIS — J189 Pneumonia, unspecified organism: Secondary | ICD-10-CM

## 2013-03-25 LAB — LEGIONELLA ANTIGEN, URINE

## 2013-03-25 LAB — EXPECTORATED SPUTUM ASSESSMENT W GRAM STAIN, RFLX TO RESP C

## 2013-03-25 LAB — CBC
Hemoglobin: 7.9 g/dL — ABNORMAL LOW (ref 13.0–17.0)
MCH: 29.5 pg (ref 26.0–34.0)
RBC: 2.68 MIL/uL — ABNORMAL LOW (ref 4.22–5.81)
WBC: 5.4 10*3/uL (ref 4.0–10.5)

## 2013-03-25 LAB — BASIC METABOLIC PANEL
CO2: 29 mEq/L (ref 19–32)
GFR calc non Af Amer: 89 mL/min — ABNORMAL LOW (ref >90)
Glucose, Bld: 87 mg/dL (ref 70–99)
Potassium: 3.5 mEq/L (ref 3.5–5.1)
Sodium: 136 mEq/L (ref 135–145)

## 2013-03-25 LAB — GLUCOSE, CAPILLARY: Glucose-Capillary: 101 mg/dL — ABNORMAL HIGH (ref 70–99)

## 2013-03-25 MED ORDER — DEXAMETHASONE 4 MG PO TABS
4.0000 mg | ORAL_TABLET | Freq: Two times a day (BID) | ORAL | Status: DC
  Administered 2013-03-25 – 2013-03-27 (×4): 4 mg via ORAL
  Filled 2013-03-25 (×5): qty 1

## 2013-03-25 MED ORDER — MORPHINE SULFATE (CONCENTRATE) 10 MG /0.5 ML PO SOLN
10.0000 mg | ORAL | Status: DC | PRN
  Administered 2013-03-25 – 2013-03-26 (×6): 10 mg via ORAL
  Filled 2013-03-25 (×6): qty 0.5

## 2013-03-25 MED ORDER — MORPHINE SULFATE ER 30 MG PO TBCR
30.0000 mg | EXTENDED_RELEASE_TABLET | Freq: Three times a day (TID) | ORAL | Status: DC
  Administered 2013-03-25 (×2): 30 mg via ORAL
  Filled 2013-03-25 (×2): qty 1

## 2013-03-25 MED ORDER — NICOTINE 14 MG/24HR TD PT24
14.0000 mg | MEDICATED_PATCH | Freq: Every day | TRANSDERMAL | Status: DC
  Administered 2013-03-25 – 2013-03-26 (×2): 14 mg via TRANSDERMAL
  Filled 2013-03-25 (×4): qty 1

## 2013-03-25 MED ORDER — METOCLOPRAMIDE HCL 5 MG PO TABS
5.0000 mg | ORAL_TABLET | Freq: Three times a day (TID) | ORAL | Status: DC
  Administered 2013-03-25 – 2013-03-27 (×6): 5 mg via ORAL
  Filled 2013-03-25 (×10): qty 1

## 2013-03-25 NOTE — Progress Notes (Signed)
Report called to Palliative floor to receiving RN. Will be transporting patient down shortly. Erskin Burnet RN

## 2013-03-25 NOTE — Progress Notes (Signed)
Subjective: [name] presents with increased SOB with productive cough. He has SCCa lung with last treatment in Nov '13, now with extensive progressive and metastatic disease. He has been started on antibiotics for presumed obstructive PNA although WBC is normal and no significant fever.   [name] had a good idea that his cancer had progressed. Furthermore, he has given thought to what he want: no additional chemo or aggressive therapy. He tells me that he has had his casket made: 62 y/o white oak boards beautifully crafted. He says he plans to go in style.   He is having pain that he rates as a 8/10 but does not want to be overly sedated with treatment  Objective: Lab:  Recent Labs  03/24/13 1311 03/24/13 1937 03/25/13 0453  WBC 8.0 6.9 5.4  NEUTROABS 6.6* 5.6  --   HGB 10.1* 9.6* 7.9*  HCT 30.7* 29.4* 24.2*  MCV 88.9 89.9 90.3  PLT 328 286 247    Recent Labs  03/24/13 1311 03/24/13 1937 03/25/13 0453  NA 138 135 136  K 4.3 3.9 3.5  CL 97* 94* 100  GLUCOSE 101* 104* 87  BUN 16.7 15 12   CREATININE 1.0 0.93 0.92  CALCIUM 9.6 9.3 8.3*    Imaging: Reviewed CT report(s)  Scheduled Meds: . budesonide-formoterol  2 puff Inhalation BID  . enoxaparin (LOVENOX) injection  40 mg Subcutaneous Q24H  . morphine  30 mg Oral BID  . phenytoin  200 mg Oral Daily  . phenytoin  30 mg Oral Daily  . piperacillin-tazobactam (ZOSYN)  IV  3.375 g Intravenous Q8H  . sodium chloride  3 mL Intravenous Q12H  . tiotropium  18 mcg Inhalation Daily  . vancomycin  750 mg Intravenous Q12H   Continuous Infusions:  PRN Meds:.albuterol, HYDROcodone-acetaminophen, HYDROmorphone (DILAUDID) injection, LORazepam, promethazine   Physical Exam: Filed Vitals:   03/25/13 0527  BP: 93/51  Pulse: 51  Temp: 99.1 F (37.3 C)  Resp: 20    gen'l- Pleasant white man in no active distress Pulm - no increased WOB at rest. Neuro - A&O, speech is clear and cognition is  normal      Assessment/Plan: 1. Oncology - metastatic recurrent SCCa lung now with lymphangitic spread on CT scan.  Plan Comfort care - will increase  MS contin to 30 mg tid, Roxanol 10 mg sl q 4 prn  Refer to Palliative care/hospice for eligibility and to enroll with [facility]  2. PNA - no fever, no leukocytosis, no copious sputum  Plan D/C antibiotics   Illene Regulus [place] [facility] (o) [phone]; (c) [phone] Call-grp - Patsi Sears IM  Tele: [phone]  03/25/2013, 12:17 PM

## 2013-03-25 NOTE — Progress Notes (Signed)
Brief note.. Full consult to follow.  62 y/o male with h/o SCC lung with progressive metastatic disease, s/p chemotherapy and radiation treatment. Who was admitted to the hospital. Patient was dmitted with dyspnea, and CT scan chest showed severe progression of disease, local recurrence of the primary mass in the perihilar aspect of the right upper lobe, extensive lymphangitic spread of disease throughout the right upper and lower lobes, new pleural disease with a malignant right-sided pleural effusion, and extensive right hilar, mediastinal and left supraclavicular lymphadenopathy  Patient does not want to undergo  chemotherapy and radiation treatment. Patient wants to pursue comfort measures and feels he is ready to die.   Recommendations: 1. Will benefit from home hospice, will get care management consult for home hospice 2. Continue MS contin 30 mg po TID, and Roxanol 10 mg q 3 hr prn 3. Start reglan 5 mg po TID for nausea associated with opiates 4. Start Decadron 4 mg po q 12 hr for adjuvant pain medication and appetite stimulation

## 2013-03-25 NOTE — Progress Notes (Signed)
Called to room that patient was having SOB. Checked on patient and increased oxygen. Went out of the room to call respiratory. When got back to the room patient had used his home quick albuterol inhaler. Patient had immediate relief of SOB. Educated patient and wife to take home inhaler home or I would have to put it in the pharmacy if not and to wait for staff to give him his PRN albuterol nebulizer.  Verbalized that they would take it home. Respiratory checked out patient and also educated him. Erskin Burnet RN

## 2013-03-26 DIAGNOSIS — J209 Acute bronchitis, unspecified: Secondary | ICD-10-CM

## 2013-03-26 DIAGNOSIS — D638 Anemia in other chronic diseases classified elsewhere: Secondary | ICD-10-CM

## 2013-03-26 DIAGNOSIS — R05 Cough: Secondary | ICD-10-CM

## 2013-03-26 DIAGNOSIS — G40909 Epilepsy, unspecified, not intractable, without status epilepticus: Secondary | ICD-10-CM

## 2013-03-26 LAB — STREP PNEUMONIAE URINARY ANTIGEN: Strep Pneumo Urinary Antigen: NEGATIVE

## 2013-03-26 MED ORDER — LORAZEPAM 1 MG PO TABS
1.0000 mg | ORAL_TABLET | Freq: Three times a day (TID) | ORAL | Status: DC
  Administered 2013-03-26 – 2013-03-27 (×4): 1 mg via ORAL
  Filled 2013-03-26 (×4): qty 1

## 2013-03-26 MED ORDER — MORPHINE SULFATE (CONCENTRATE) 10 MG /0.5 ML PO SOLN
10.0000 mg | ORAL | Status: DC | PRN
  Administered 2013-03-26 – 2013-03-27 (×10): 10 mg via ORAL
  Filled 2013-03-26 (×11): qty 0.5

## 2013-03-26 MED ORDER — MORPHINE SULFATE ER 30 MG PO TBCR
45.0000 mg | EXTENDED_RELEASE_TABLET | Freq: Three times a day (TID) | ORAL | Status: DC
  Administered 2013-03-26 – 2013-03-27 (×4): 45 mg via ORAL
  Filled 2013-03-26 (×4): qty 1

## 2013-03-26 MED ORDER — PHENYTOIN SODIUM EXTENDED 100 MG PO CAPS
200.0000 mg | ORAL_CAPSULE | Freq: Two times a day (BID) | ORAL | Status: DC
  Filled 2013-03-26: qty 2

## 2013-03-26 MED ORDER — SENNOSIDES-DOCUSATE SODIUM 8.6-50 MG PO TABS
1.0000 | ORAL_TABLET | Freq: Every day | ORAL | Status: DC
  Filled 2013-03-26: qty 1

## 2013-03-26 MED ORDER — PHENYTOIN SODIUM EXTENDED 30 MG PO CAPS: 30.0000 mg | ORAL_CAPSULE | Freq: Two times a day (BID) | ORAL | Status: DC

## 2013-03-26 MED ORDER — ACETAMINOPHEN 325 MG PO TABS: 650.0000 mg | ORAL_TABLET | Freq: Four times a day (QID) | ORAL | Status: DC | PRN

## 2013-03-26 MED ORDER — PHENYTOIN SODIUM EXTENDED 100 MG PO CAPS
200.0000 mg | ORAL_CAPSULE | Freq: Two times a day (BID) | ORAL | Status: DC
  Administered 2013-03-26 – 2013-03-27 (×3): 200 mg via ORAL
  Filled 2013-03-26 (×4): qty 2

## 2013-03-26 MED ORDER — ALBUTEROL SULFATE (5 MG/ML) 0.5% IN NEBU
2.5000 mg | INHALATION_SOLUTION | RESPIRATORY_TRACT | Status: DC
  Administered 2013-03-26 – 2013-03-27 (×4): 2.5 mg via RESPIRATORY_TRACT
  Filled 2013-03-26 (×3): qty 0.5

## 2013-03-26 MED ORDER — SENNOSIDES-DOCUSATE SODIUM 8.6-50 MG PO TABS
1.0000 | ORAL_TABLET | Freq: Every evening | ORAL | Status: DC | PRN
  Administered 2013-03-27: 1 via ORAL
  Filled 2013-03-26: qty 1

## 2013-03-26 MED ORDER — BISACODYL 10 MG RE SUPP: 10.0000 mg | Freq: Every day | RECTAL | Status: DC | PRN

## 2013-03-26 MED ORDER — PHENYTOIN SODIUM EXTENDED 30 MG PO CAPS
30.0000 mg | ORAL_CAPSULE | Freq: Two times a day (BID) | ORAL | Status: DC
  Filled 2013-03-26: qty 1

## 2013-03-26 MED ORDER — IPRATROPIUM BROMIDE 0.02 % IN SOLN
0.5000 mg | RESPIRATORY_TRACT | Status: DC
  Administered 2013-03-26 – 2013-03-27 (×4): 0.5 mg via RESPIRATORY_TRACT
  Filled 2013-03-26 (×4): qty 2.5

## 2013-03-26 MED ORDER — PHENYTOIN SODIUM EXTENDED 30 MG PO CAPS
30.0000 mg | ORAL_CAPSULE | Freq: Two times a day (BID) | ORAL | Status: DC
  Administered 2013-03-26 (×2): 30 mg via ORAL
  Filled 2013-03-26 (×4): qty 1

## 2013-03-26 MED ORDER — PHENYTOIN SODIUM EXTENDED 100 MG PO CAPS
200.0000 mg | ORAL_CAPSULE | Freq: Two times a day (BID) | ORAL | Status: DC
Start: 2013-03-26 — End: 2013-03-26

## 2013-03-26 NOTE — Care Management Note (Addendum)
    Page 1 of 2   03/27/2013     1:37:15 PM   CARE MANAGEMENT NOTE 03/27/2013  Patient:  Anthony [name], Anthony [name]   Account Number:  [id number]  Date Initiated:  03/26/2013  Documentation initiated by:  Lanier Clam  Subjective/Objective Assessment:   ADMITTED W/SOB.RESP FAILURE.     Action/Plan:   FROM HOME W/SPOUSE.STATES HE HAS HOME 02.   Anticipated DC Date:  03/28/2013   Anticipated DC Plan:  HOME W HOSPICE CARE  In-house referral  Hospice / Palliative Care      DC Planning Services  CM consult      PAC Choice  HOSPICE  DURABLE MEDICAL EQUIPMENT   Choice offered to / List presented to:  C-1 Patient   DME arranged  HOSPITAL BED  SHOWER STOOL      DME agency  [facility]        [facility] agency  HOSPICE   Status of service:  Completed, signed off Medicare Important Message given?   (If response is "NO", the following Medicare IM given date fields will be blank) Date Medicare IM given:   Date Additional Medicare IM given:    Discharge Disposition:  HOME W HOSPICE CARE  Per UR Regulation:  Reviewed for med. necessity/level of care/duration of stay  If discussed at Long Length of Stay Meetings, dates discussed:    Comments:  03-27-13 Lorenda Ishihara RN CM Hospice approved, patient ok for d/c, hospital bed to be delivered today.  03-27-13 Lorenda Ishihara RN CM 1000 Followed up with patient regarding hospice choice. [facility]. Contacted Hospice and faxed information to them, awaiting approval.  03/26/13 KATHY MAHABIR RN,BSN NCM [phone] REFERRAL FOR HOME HOSPICE CHOICE.LIVES IN [employer]PROVIDED W/HOME HOSPICE PROVIDER LIST.HE WILL DECIDE IN AM.HE FEELS HE CAN GET HOSPITAL BED,& OTHER EQUIPMENT,ONCE @ HOME SINCE HE SLEEPS IN [place].FAMILY ABLE TO TRANSP HOME.

## 2013-03-26 NOTE — Progress Notes (Signed)
Subjective: Patient seen and examined, still c/o pain, says that the pain medication is not holding for long and is experiencing pain after two hrs of taking the medicine. Was started on MS contin 30 mg TID yesterday. Appetite seems to be improving with Decadron and Reglan, ate good breakfast this morning. Still gets short of breath on exertion, only getting nebs BID Filed Vitals:   03/26/13 0534  BP: 88/51  Pulse: 87  Temp: 97.9 F (36.6 C)  Resp: 18    Chest: Bilateral wheezing Heart : S1S2 RRR Abdomen: Soft, nontender Ext : No edema Neuro: Alert, oriented x 3  A/P Metastatic Small cell lung cancer Acute on Chronic pain Dyspnea Anorexia Anxiety   Dyspnea- Start duoneb Q 4 hr schrduled, Will d/c tiotropium. Will increase the dose of MS contin to 45 mg Po TID, and change the Roxanol to 10 mg  q 2 hr prn.  Acute on chronic pain- see above, continue MS contin  Seizure disorder- patient says he takes Dilantin 230 mg Po BID at home and has been only getting 230 mg daily in the hospital. Dilantin level on 4/11 was less than 2.5, have called the pharmacy to confirm the home dose. Will increase the doe of Dilantin to 230 mg Po BID  Anorexia- seems to be improving with decadron and reglan.  Anxiety- Will make the ativan to scheduled dose of 1 mg po TID    Meredeth Ide Palliative Medicine Team [phone]

## 2013-03-26 NOTE — Progress Notes (Signed)
Subjective: Palliative care team consult appreciated. Change in pain medications noted. He is eating better. He is wanting to  See Dr. Arbutus Ped tomorrow before going home with hospice care.  Objective: Lab:  Recent Labs  03/24/13 1311 03/24/13 1937 03/25/13 0453  WBC 8.0 6.9 5.4  NEUTROABS 6.6* 5.6  --   HGB 10.1* 9.6* 7.9*  HCT 30.7* 29.4* 24.2*  MCV 88.9 89.9 90.3  PLT 328 286 247    Recent Labs  03/24/13 1311 03/24/13 1937 03/25/13 0453  NA 138 135 136  K 4.3 3.9 3.5  CL 97* 94* 100  GLUCOSE 101* 104* 87  BUN 16.7 15 12   CREATININE 1.0 0.93 0.92  CALCIUM 9.6 9.3 8.3*    Imaging:  Scheduled Meds: . ipratropium  0.5 mg Nebulization Q4H   And  . albuterol  2.5 mg Nebulization Q4H  . budesonide-formoterol  2 puff Inhalation BID  . dexamethasone  4 mg Oral Q12H  . enoxaparin (LOVENOX) injection  40 mg Subcutaneous Q24H  . LORazepam  1 mg Oral TID  . metoCLOPramide  5 mg Oral TID AC & HS  . morphine  45 mg Oral TID  . nicotine  14 mg Transdermal Daily  . phenytoin  200 mg Oral BID   And  . phenytoin  30 mg Oral BID   Continuous Infusions:  PRN Meds:.acetaminophen, albuterol, bisacodyl, morphine CONCENTRATE, promethazine, senna-docusate   Physical Exam: Filed Vitals:   03/26/13 0534  BP: 88/51  Pulse: 87  Temp: 97.9 F (36.6 C)  Resp: 18   gen'l - thin white man who does not appear to be in distress Pulm - no increased WOB at rest     Assessment/Plan: Oncology - widely metastatic SCCa . Patient is very accepting of his diagnosis and wants to go home with Hospice care.  Plan  changes in pain medication noted.  Dispo - hopefully home tomorrow.   Illene Regulus [place] [facility] (o) [phone]; (c) [phone] Call-grp - Patsi Sears IM  Tele: [phone]  03/26/2013, 1:44 PM

## 2013-03-26 NOTE — Consult Note (Signed)
Patient Anthony [name]      DOB: September 26, 1951      YNW:[id number]     Consult Note from the Palliative Medicine Team at [facility]    Consult Requested by:      PCP: Lillia Mountain, MD Reason for Consultation: Goals of [phone]  Assessment of patients Current state: 62 y/o male with h/o SCC lung with progressive metastatic disease, s/p chemotherapy and radiation treatment. Who was admitted to the hospital. Patient was dmitted with dyspnea, and CT scan chest showed severe progression of disease, local recurrence of the primary mass in the perihilar aspect of the right upper lobe, extensive lymphangitic spread of disease throughout the right upper and lower lobes, new pleural disease with a malignant right-sided pleural effusion, and extensive right hilar, mediastinal and left supraclavicular lymphadenopathy  Patient does not want to pursue further chemotherapy or radiation treatment, though he wants to discuss with Dr Shirline Frees in am. He would like to go home with hospice  Goals of Care: 1.  Code Status : DNR/DNI   2. Scope of Treatment: 1. Vital Signs: per protocol  2. Respiratory/Oxygen: Via nasal canula 3. Nutritional Support/Tube Feeds: No long term feeding with peg or panda tube 4. Antibiotics: Continue as needed 5. Blood Products: Continue as needed 6. IVF: continue as needed 7. Review of Medications to be discontinued: None 8. Labs: Continue as needed 9. Telemetry: No    4. Disposition: Home with hospice   3. Symptom Management:   1. Anxiety/Agitation: Ativan 1 mg po TID 2. Pain: MS contin 30 mg po TID, morphine 10 mg po q 3 hrs prn 3. Bowel Regimen: Start Senakot s tab po q hs, Dulcolax suppository prn 4. Fever: Tylenol prn 5. Nausea/Vomiting:Phenergan, will also start Reglan 5 mg po TID which should also help with appetite as it is prokinetic agent 6. Anorexia- Will start decadron 4 mg po BID  4. Psychosocial:   5. Spiritual:        Patient  Documents Completed or Given: Document Given Completed  Advanced Directives Pkt    MOST    DNR    Gone from My Sight    Hard Choices      Brief HPI: 62 y/o male with h/o SCC lung with progressive metastatic disease, s/p chemotherapy and radiation treatment. Who was admitted to the hospital. Patient was dmitted with dyspnea, and CT scan chest showed severe progression of disease, local recurrence of the primary mass in the perihilar aspect of the right upper lobe, extensive lymphangitic spread of disease throughout the right upper and lower lobes, new pleural disease with a malignant right-sided pleural effusion, and extensive right hilar, mediastinal and left supraclavicular lymphadenopathy   Discussed the goals of care with the patient and his wife at the bedside. Discussed  The code status, CPR, intubation, mechanical ventilation, further chemotherapy.   ROS: Pain- pain is not well controlled, says 8/10 in intensity, started on MS contin 30 mg TID and Morphine 10 mg q 3 hr prn Insomnia- denies Nausea- denies Anxiety- Yes Breathlessness- yes Constipation- denies Depression- denies   PMH:  Past Medical History  Diagnosis Date  . Crohn's disease of colon   . Pulmonary emboli   . COPD (chronic obstructive pulmonary disease)   . Seizure disorder   . Kidney mass   . Arthritis   . Emphysema of lung   . Weight loss, unintentional   . Cough   . Chest pain   . Weakness   . Seizures  last one ~ 3 yrs ago  . Hx of radiation therapy 10/13/12 -10/26/12    central chest, brain-prophylacitc  . Shortness of breath   . Pneumonia 03/02/2013  . Cancer 06/09/12    lung RUL  . Lung cancer 06/09/12    biopsy- RUL neuroendocrine carcinoma, small cell type     PSH: Past Surgical History  Procedure Laterality Date  . Arm surgery      left  . Right knee      right knee, hx of injury  . Rectal surgery      x7, Crohn's disease  . Abdominal surgery      x3, Crohn's disease  . Video  bronchoscopy  06/09/2012    Procedure: VIDEO BRONCHOSCOPY WITHOUT FLUORO;  Surgeon: Leslye Peer, MD;  Location: [facility];  Service: Cardiopulmonary;  Laterality: Bilateral;  . Portacath placement  07/05/2012    Procedure: INSERTION PORT-A-CATH;  Surgeon: Mariella Saa, MD;  Location: [facility] OR;  Service: General;  Laterality: N/A;  . Port-a-cath removal  08/24/2012    Procedure: MINOR REMOVAL PORT-A-CATH;  Surgeon: Mariella Saa, MD;  Location: [facility];  Service: General;  Laterality: Right;   I have reviewed the FH and SH and  If appropriate update it with new information. No Known Allergies Scheduled Meds: . budesonide-formoterol  2 puff Inhalation BID  . dexamethasone  4 mg Oral Q12H  . enoxaparin (LOVENOX) injection  40 mg Subcutaneous Q24H  . metoCLOPramide  5 mg Oral TID AC & HS  . morphine  30 mg Oral TID  . nicotine  14 mg Transdermal Daily  . phenytoin  200 mg Oral Daily  . phenytoin  30 mg Oral Daily  . tiotropium  18 mcg Inhalation Daily   Continuous Infusions:  PRN Meds:.albuterol, LORazepam, morphine CONCENTRATE, promethazine    BP 88/51  Pulse 87  Temp(Src) 97.9 F (36.6 C) (Oral)  Resp 18  Ht 5\' 11"  (1.803 m)  Wt 57.153 kg (126 lb)  BMI 17.58 kg/m2  SpO2 99%      Intake/Output Summary (Last 24 hours) at 03/26/13 0732 Last data filed at 03/26/13 0537  Gross per 24 hour  Intake    240 ml  Output    550 ml  Net   -310 ml    Physical Exam:  General: appear in no acute distress HEENT:  NCAT Chest:  Scattered wheezing CVS: s1s2 RRR Neuro: A Ox3, has decision making capacity  Labs: CBC    Component Value Date/Time   WBC 5.4 03/25/2013 0453   WBC 8.0 03/24/2013 1311   RBC 2.68* 03/25/2013 0453   RBC 3.45* 03/24/2013 1311   HGB 7.9* 03/25/2013 0453   HGB 10.1* 03/24/2013 1311   HCT 24.2* 03/25/2013 0453   HCT 30.7* 03/24/2013 1311   PLT 247 03/25/2013 0453   PLT 328 03/24/2013 1311   MCV 90.3 03/25/2013 0453   MCV 88.9  03/24/2013 1311   MCH 29.5 03/25/2013 0453   MCH 29.2 03/24/2013 1311   MCHC 32.6 03/25/2013 0453   MCHC 32.8 03/24/2013 1311   RDW 16.5* 03/25/2013 0453   RDW 17.1* 03/24/2013 1311   LYMPHSABS 0.6* 03/24/2013 1937   LYMPHSABS 0.6* 03/24/2013 1311   MONOABS 0.7 03/24/2013 1937   MONOABS 0.7 03/24/2013 1311   EOSABS 0.1 03/24/2013 1937   EOSABS 0.1 03/24/2013 1311   BASOSABS 0.0 03/24/2013 1937   BASOSABS 0.0 03/24/2013 1311    BMET    Component Value Date/Time  NA 136 03/25/2013 0453   NA 138 03/24/2013 1311   K 3.5 03/25/2013 0453   K 4.3 03/24/2013 1311   CL 100 03/25/2013 0453   CL 97* 03/24/2013 1311   CO2 29 03/25/2013 0453   CO2 28 03/24/2013 1311   GLUCOSE 87 03/25/2013 0453   GLUCOSE 101* 03/24/2013 1311   BUN 12 03/25/2013 0453   BUN 16.7 03/24/2013 1311   CREATININE 0.92 03/25/2013 0453   CREATININE 1.0 03/24/2013 1311   CALCIUM 8.3* 03/25/2013 0453   CALCIUM 9.6 03/24/2013 1311   GFRNONAA 89* 03/25/2013 0453   GFRAA >90 03/25/2013 0453    CMP     Component Value Date/Time   NA 136 03/25/2013 0453   NA 138 03/24/2013 1311   K 3.5 03/25/2013 0453   K 4.3 03/24/2013 1311   CL 100 03/25/2013 0453   CL 97* 03/24/2013 1311   CO2 29 03/25/2013 0453   CO2 28 03/24/2013 1311   GLUCOSE 87 03/25/2013 0453   GLUCOSE 101* 03/24/2013 1311   BUN 12 03/25/2013 0453   BUN 16.7 03/24/2013 1311   CREATININE 0.92 03/25/2013 0453   CREATININE 1.0 03/24/2013 1311   CALCIUM 8.3* 03/25/2013 0453   CALCIUM 9.6 03/24/2013 1311   PROT 6.4 03/24/2013 1937   PROT 6.7 03/24/2013 1311   ALBUMIN 2.4* 03/24/2013 1937   ALBUMIN 2.3* 03/24/2013 1311   AST 28 03/24/2013 1937   AST 30 03/24/2013 1311   ALT 11 03/24/2013 1937   ALT 10 03/24/2013 1311   ALKPHOS 160* 03/24/2013 1937   ALKPHOS 180* 03/24/2013 1311   BILITOT 0.3 03/24/2013 1937   BILITOT 0.35 03/24/2013 1311   GFRNONAA 89* 03/25/2013 0453   GFRAA >90 03/25/2013 0453      Time In Time Out Total Time Spent with Patient Total Overall Time  4 30 pm 6 pm 60 min     Greater than 50%  of this time was spent counseling and coordinating care related to the above assessment and plan.

## 2013-03-26 NOTE — Progress Notes (Signed)
Nutrition Brief Note  Patient identified on the Malnutrition Screening Tool (MST) Report  Body mass index is 17.58 kg/(m^2). Patient meets criteria for underweight based on current BMI.   Current diet order is regular, patient is consuming approximately 75% of meals at this time. Labs and medications reviewed.   Pt is currently on comfort measures for lung cancer. Per nurse, he has been encouraged to consume small, frequent meals. Pt reports that he does not want any nutritional supplements, and that he feels he has been eating well the past couple of days. He has started Reglan and decadron which may help with po intake.  No nutrition interventions warranted at this time. If nutrition issues arise, please consult RD.   Anthony [name] RD, LDN

## 2013-03-27 ENCOUNTER — Ambulatory Visit: Payer: Self-pay | Admitting: Internal Medicine

## 2013-03-27 DIAGNOSIS — C50919 Malignant neoplasm of unspecified site of unspecified female breast: Secondary | ICD-10-CM

## 2013-03-27 DIAGNOSIS — R0602 Shortness of breath: Secondary | ICD-10-CM

## 2013-03-27 LAB — CULTURE, RESPIRATORY W GRAM STAIN

## 2013-03-27 MED ORDER — MORPHINE SULFATE (CONCENTRATE) 10 MG /0.5 ML PO SOLN: 10.0000 mg | ORAL | Status: AC | PRN

## 2013-03-27 MED ORDER — SENNOSIDES-DOCUSATE SODIUM 8.6-50 MG PO TABS: 2.0000 | ORAL_TABLET | Freq: Every day | ORAL | Status: AC

## 2013-03-27 MED ORDER — METOCLOPRAMIDE HCL 5 MG PO TABS: 5.0000 mg | ORAL_TABLET | Freq: Three times a day (TID) | ORAL | Status: AC

## 2013-03-27 MED ORDER — MORPHINE SULFATE ER 30 MG PO TBCR: 30.0000 mg | EXTENDED_RELEASE_TABLET | Freq: Three times a day (TID) | ORAL | Status: AC

## 2013-03-27 NOTE — Discharge Summary (Signed)
Physician Discharge Summary  Patient ID: Anthony [name] MRN: [id number] DOB/AGE: 61-Apr-1952 61 y.o.  Admit date: 03/24/2013 Discharge date: 03/27/2013  Admission Diagnoses: Respiratory failure with hypoxia Metastatic small cell lung cancer Seizures Anemia  Discharge Diagnoses:  Principal Problem:   Respiratory failure with hypoxia Active Problems:  Metastatic small cell lung cancer   Seizures   Anemia of chronic disease   Discharged Condition: fair  Hospital Course: The patient was admitted on April 12 with worsening dyspnea and cough and progressing generalized weakness.. Recent CT scan of the chest showed severe progression of disease in the right lung, extensive lymphangitic spread, new pleural disease, extensive lymphadenopathy and new liver metastases. The patient was seen by the hospice service. He clearly does not want to undergo any further chemotherapy or radiation therapy. He is in no CODE BLUE and mainly desires comfort measures. He would accept treatment for infection and for comfort purposes. He was started on MS Contin and Roxanol as needed as well as Reglan for nausea and lorazepam was continued. He will go home with home hospice.  Consults: hematology/oncology and Palliative care  Significant Diagnostic Studies: radiology:   Treatments: analgesia: Morphine  Discharge Exam: Blood pressure 94/60, pulse 100, temperature 98.3 F (36.8 C), temperature source Oral, resp. rate 18, height 5\' 11"  (1.803 m), weight 57.153 kg (126 lb), SpO2 98.00%. Resp: rhonchi bilaterally Cardio: regular rate and rhythm, S1, S2 normal, no murmur, click, rub or gallop  Disposition: 01-Home or Self Care   Future Appointments Provider Department Dept Phone   03/27/2013 3:45 PM Si Gaul, MD [facility] [phone]       Medication List    TAKE these medications       albuterol (2.5 MG/3ML) 0.083% nebulizer solution  Commonly known as:  PROVENTIL   Take 3 mLs (2.5 mg total) by nebulization every 4 (four) hours as needed for wheezing.     albuterol 108 (90 BASE) MCG/ACT inhaler  Commonly known as:  PROVENTIL HFA;VENTOLIN HFA  Inhale 2 puffs into the lungs every 6 (six) hours as needed. For shortness of breath.     budesonide-formoterol 160-4.5 MCG/ACT inhaler  Commonly known as:  SYMBICORT  Inhale 2 puffs into the lungs 2 (two) times daily.     cyanocobalamin 1000 MCG/ML injection  Commonly known as:  (VITAMIN B-12)  Inject into the muscle every 30 (thirty) days. Administers on the 15th of each month.     LORazepam 1 MG tablet  Commonly known as:  ATIVAN  Take 1 mg by mouth 3 (three) times daily.     metoCLOPramide 5 MG tablet  Commonly known as:  REGLAN  Take 1 tablet (5 mg total) by mouth 4 (four) times daily -  before meals and at bedtime.     morphine 30 MG 12 hr tablet  Commonly known as:  MS CONTIN  Take 1 tablet (30 mg total) by mouth 3 (three) times daily.     morphine CONCENTRATE 10 mg / 0.5 ml concentrated solution  Take 0.5 mLs (10 mg total) by mouth every 2 (two) hours as needed.     phenytoin 100 MG ER capsule  Commonly known as:  DILANTIN  Take 200 mg by mouth 2 (two) times daily. Take with Phenytoin 30 mg to make 230 mg twice daily     phenytoin 30 MG ER capsule  Commonly known as:  DILANTIN  Take 30 mg by mouth 2 (two) times daily. Take with Phenytoin 200  mg to make 230 mg twice daily     promethazine 25 MG tablet  Commonly known as:  PHENERGAN  Take 25 mg by mouth daily as needed. For nausea.     senna-docusate 8.6-50 MG per tablet  Commonly known as:  Senokot-S  Take 2 tablets by mouth daily.     tiotropium 18 MCG inhalation capsule  Commonly known as:  SPIRIVA  Place 18 mcg into inhaler and inhale daily.           Follow-up Information   Follow up with Lillia Mountain, MD In 2 weeks.   Contact information:   [address], [address] [place] [address] [phone]       Signed: Lillia Mountain 03/27/2013, 7:52 AM

## 2013-03-27 NOTE — Progress Notes (Signed)
DIAGNOSIS:  1. Extensive stage small cell lung cancer diagnosed in June of 2013 2. Right upper extremity deep vein thrombosis diagnosed 08/19/2012 3. Prophylactic cranial irradiation completed 10/26/2012 under the care of Dr. Kathrynn Running. 4. Palliative radiotherapy to the central right chest mass under the care of Dr. Kathrynn Running completed 10/26/2012.  PRIOR THERAPY:  1) Status post 4 cycles of systemic chemotherapy with carboplatin for AUC of 5 given on day 1 and etoposide 100 mg/M2 given on days 1, 2 and 3 with Neulasta support on day 4 last cycle of chemotherapy was given on 08/22/2012 with partial response.  2) Prophylactic cranial irradiation completed 10/26/2012 under the care of Dr. Kathrynn Running.  3) Palliative radiotherapy to the central right chest mass under the care of Dr. Kathrynn Running completed 10/26/2012.   CURRENT THERAPY: Coumadin at 5 mg by mouth daily or as directed by the [facility] Coumadin clinic.   Subjective: Patient is seen and examined today. His family were at the bedside. He was recently admitted with worsening dyspnea and CT scan of the chest, abdomen and pelvis showed significant evidence for disease progression. The patient is currently at the palliative care unit at [facility] and he is considered for hospice referral. He still complaining of pain in the right side of the chest as well as back. He is on pain medication in the form of MS Contin as well as morphine sulfate. He continues to have shortness of breath with exertion.   Objective: Vital signs in last 24 hours: Temp:  [98.3 F (36.8 C)] 98.3 F (36.8 C) (04/14 0629) Pulse Rate:  [100] 100 (04/14 0629) Resp:  [18] 18 (04/14 0629) BP: (94)/(60) 94/60 mmHg (04/14 0629) SpO2:  [98 %-99 %] 98 % (04/14 0629)  Intake/Output from previous day:   Intake/Output this shift:    General appearance: alert, cooperative and mild distress Resp: wheezes bilaterally Cardio: regular rate and rhythm, S1, S2  normal, no murmur, click, rub or gallop GI: soft, non-tender; bowel sounds normal; no masses,  no organomegaly Extremities: extremities normal, atraumatic, no cyanosis or edema  Lab Results:   Recent Labs  03/24/13 1937 03/25/13 0453  WBC 6.9 5.4  HGB 9.6* 7.9*  HCT 29.4* 24.2*  PLT 286 247   BMET  Recent Labs  03/24/13 1937 03/25/13 0453  NA 135 136  K 3.9 3.5  CL 94* 100  CO2 28 29  GLUCOSE 104* 87  BUN 15 12  CREATININE 0.93 0.92  CALCIUM 9.3 8.3*    Studies/Results: No results found.  Medications: I have reviewed the patient's current medications.  CODE STATUS: No CODE BLUE.  Assessment/Plan: This is a very pleasant 62 years old white male with extensive stage small cell lung cancer status post systemic chemotherapy with carboplatin and etoposide as well as prophylactic cranial radiation and palliative radiation to the chest. Unfortunately the patient presented with significant disease progression in the chest, abdomen and pelvis. He is not a good candidate for second line chemotherapy as his performance status has significantly declined. I agree with the current plan for this patient by referring to hospice service. He is expected to be discharged from the hospital today with home hospice. I had a lengthy discussion with the patient and his family about this option today and they are all in agreement with the current hospice plan.  LOS: 3 days    Kurt Azimi K. 03/27/2013

## 2013-03-31 LAB — CULTURE, BLOOD (ROUTINE X 2): Culture: NO GROWTH

## 2013-04-13 DEATH — deceased

## 2013-04-18 ENCOUNTER — Other Ambulatory Visit: Payer: Self-pay | Admitting: Lab

## 2013-12-25 IMAGING — CT CT ABD-PELV W/ CM
2 of 5 series · 16 of 46 positions shown, 18 images · IV contrast (OMNIPAQUE)
Comparison: CT 06/02/2012 and on and back on the fat along the
right at the

CT CHEST

CLINICAL DATA: Lung cancer diagnosed 1783.  Chemotherapy ongoing.
Small cell lung cancer . History of bowel resection for Crohn's
disease.

CT CHEST, ABDOMEN AND PELVIS WITH CONTRAST
TECHNIQUE: Multidetector CT imaging of the chest, abdomen and
pelvis was performed following the standard protocol during bolus
administration of intravenous contrast.
Contrast:  80 ml and Omnipaque 300

[Series 2: cap with st · axial · 0.79mm/px · z∈[-672,-72]mm · 13 of 136 slices shown, 15 images]
[im 8/136  soft-tissue]
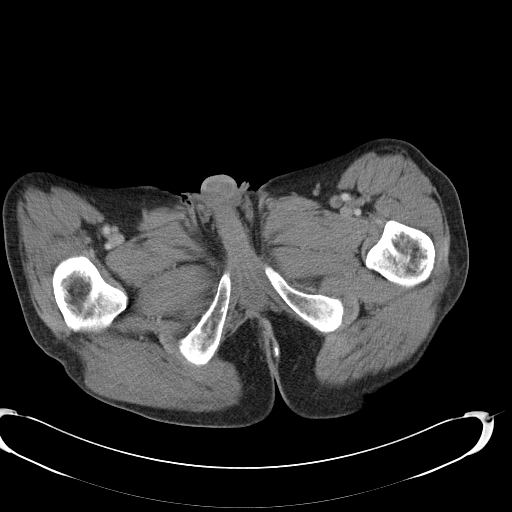
[im 8/136  bone]
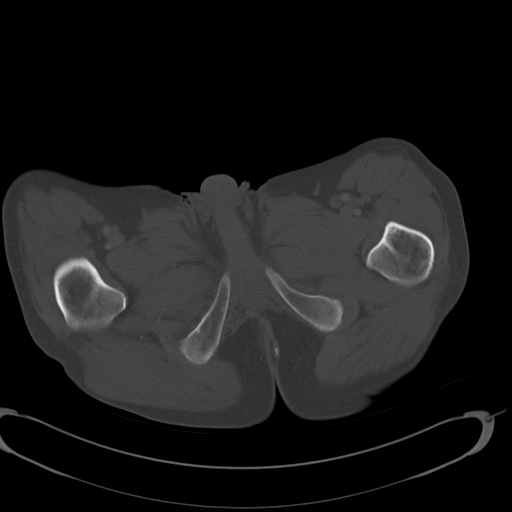
[im 16/136  soft-tissue]
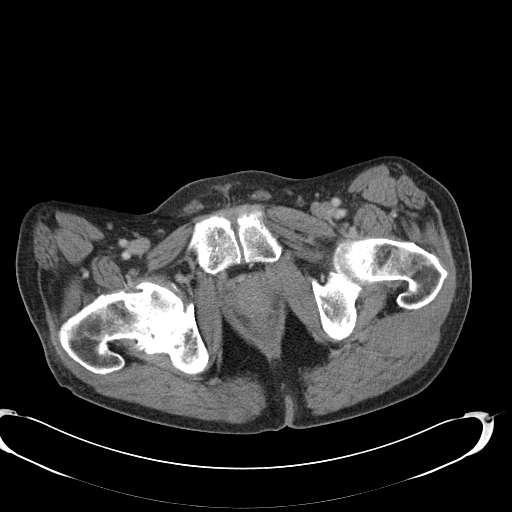
[im 32/136  soft-tissue]
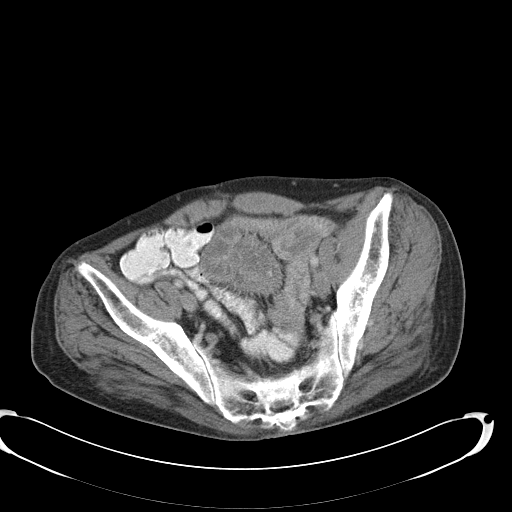
[im 40/136  soft-tissue]
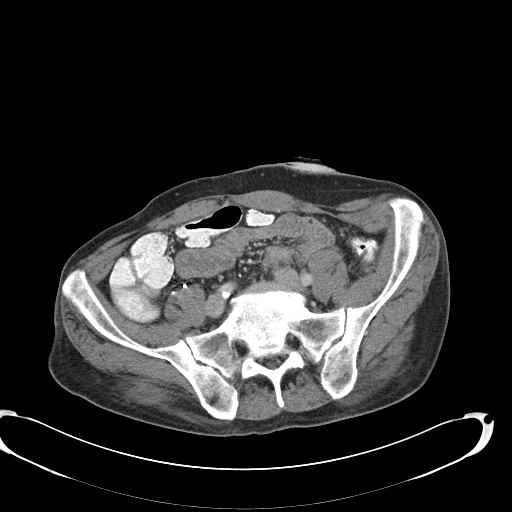
[im 48/136  soft-tissue]
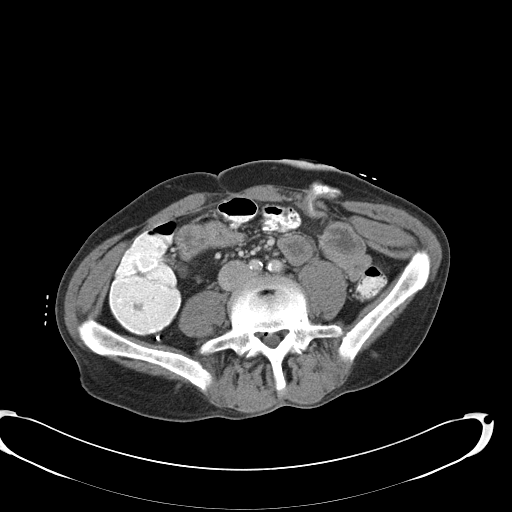
[im 56/136  soft-tissue]
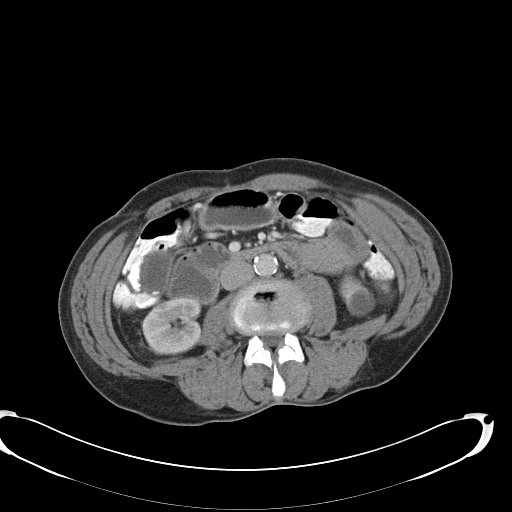
[im 72/136  soft-tissue]
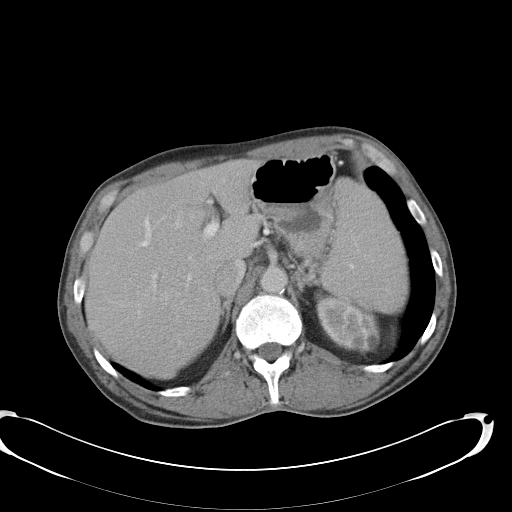
[im 80/136  soft-tissue]
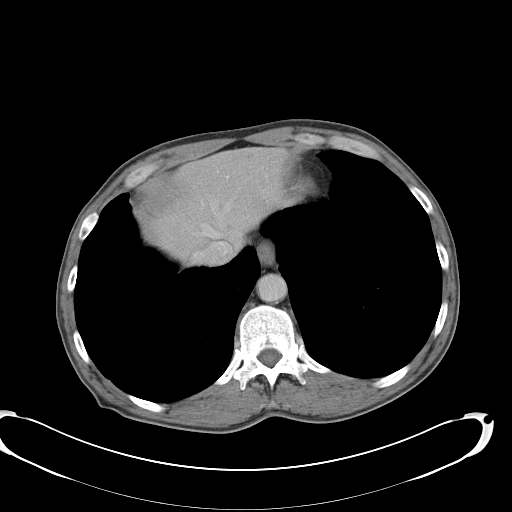
[im 88/136  soft-tissue]
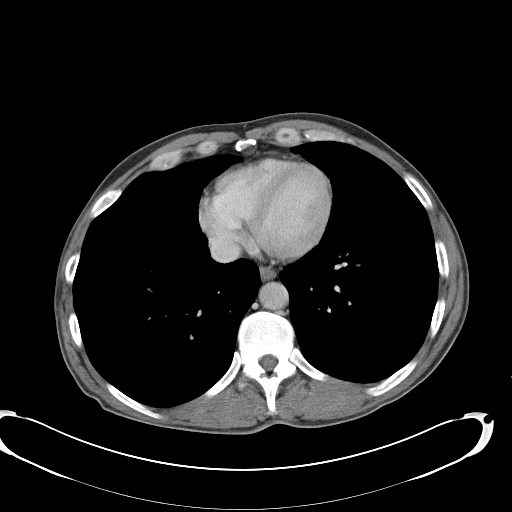
[im 88/136  bone]
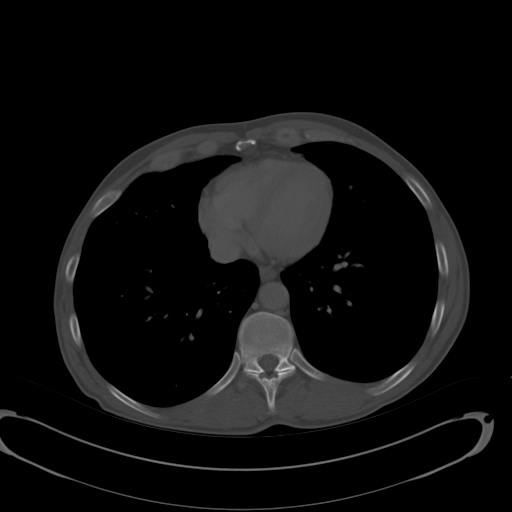
[im 96/136  soft-tissue]
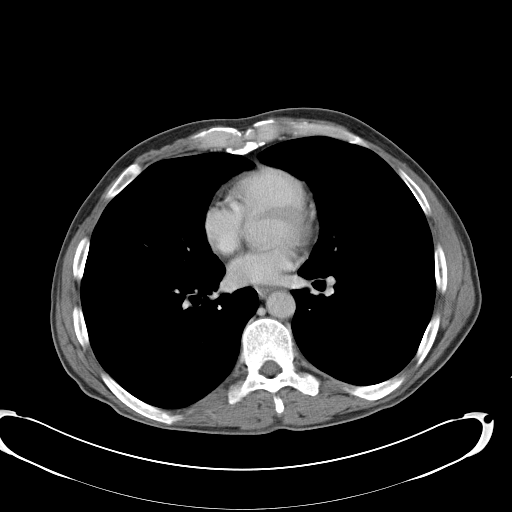
[im 104/136  soft-tissue]
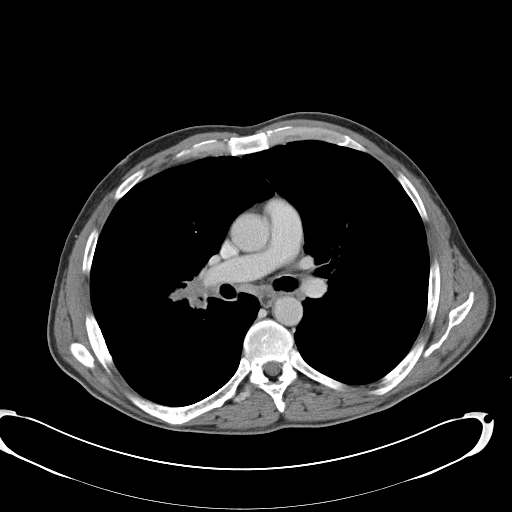
[im 120/136  soft-tissue]
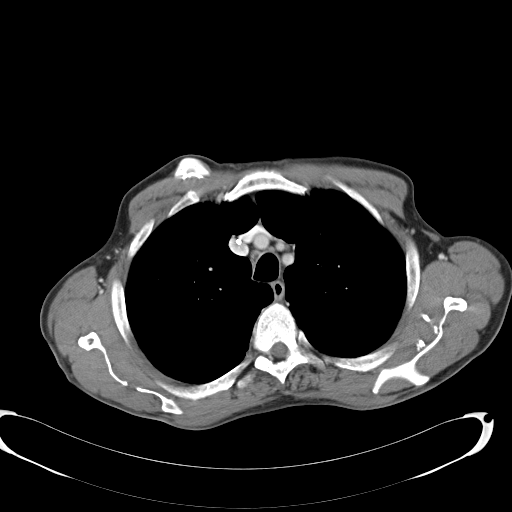
[im 128/136  soft-tissue]
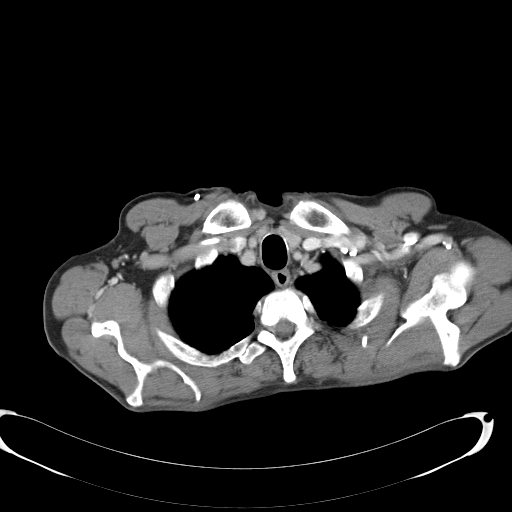

[Series 602: <mpr thick range> · coronal · 1.32mm/px · 3 of 87 slices shown]
[im 29/87  soft-tissue]
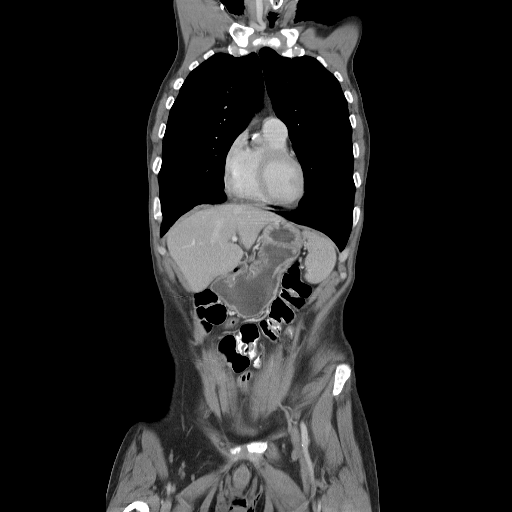
[im 39/87  soft-tissue]
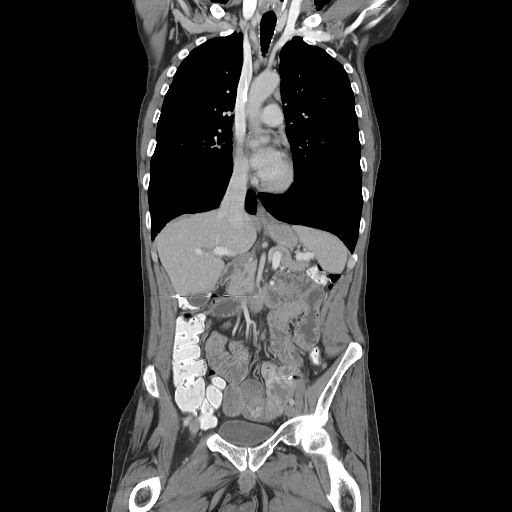
[im 48/87  soft-tissue]
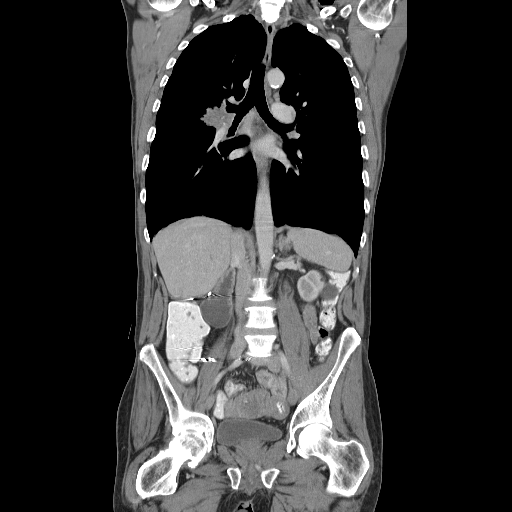

[16 of 46 positions shown; findings below may reference images not displayed]

FINDINGS: No axillary or left supraclavicular lymphadenopathy.  A
port in the right anterior chest wall.  Small mediastinal lymph
nodes are stable in size compared to prior.  Largest is a
subcarinal node measuring 9 mm.  Again demonstrated the right hilar
adenopathy measuring approximate 19 x 19 mm decreased from 24 x 22
cm.

The large mass extending from the right hilum along the fissure is
decreased in size measuring 37 x 23 mm compared to 71 x 48 mm on
prior.

Within the right upper lobe, there is an 11 mm nodule (image 21)
which it is increased from approximate 4 mm on prior.  Nodularity
surrounding the primary hilar lesion  is ill defined and is
unchanged.
IMPRESSION: 1.  Interval significant decrease in size of dominant right hilar
mass.
2.  Increase in size of right upper lobe pulmonary nodule
consistent with disease progression..

CT ABDOMEN AND PELVIS
FINDINGS: No focal hepatic lesion.  The pancreas, spleen, adrenal
glands are normal.

The enhancing mass in the medial upper left kidney measures 3.1 x
2.9 cm not changed from 3.0 x 2.5 cm on prior.  Several
intermediate density cystic lesion associated with the left and
right kidney which are not significantly changed.

The stomach, small bowel, cecum are normal.  There are surgical
clips adjacent the cecum.  There is a left abdominal wall
colostomy. Rectal pouch.

Abdominal aorta normal caliber.  No retroperitoneal periportal
lymphadenopathy.  Review bone windows demonstrates no aggressive
osseous lesions.
IMPRESSION: 1..  No evidence of disease progression in the pelvis.
2.  Stable enhancing mass within the medial left kidney
representing either metastasis or more likely renal cell carcinoma.
3.  Left lower quadrant colostomy without evidence of complication.

## 2014-08-09 NOTE — Telephone Encounter (Signed)
Encounter was telephone call. 

## 2016-11-28 ENCOUNTER — Other Ambulatory Visit: Payer: Self-pay | Admitting: Nurse Practitioner
